# Patient Record
Sex: Female | Born: 1990
Health system: Southern US, Community
[De-identification: ages and names within clinical notes are randomized; demographics above are authoritative.]

## PROBLEM LIST (undated history)

## (undated) DIAGNOSIS — N879 Dysplasia of cervix uteri, unspecified: Secondary | ICD-10-CM

## (undated) DIAGNOSIS — N611 Abscess of the breast and nipple: Secondary | ICD-10-CM

## (undated) DIAGNOSIS — R87619 Unspecified abnormal cytological findings in specimens from cervix uteri: Secondary | ICD-10-CM

## (undated) DIAGNOSIS — F419 Anxiety disorder, unspecified: Secondary | ICD-10-CM

## (undated) DIAGNOSIS — J45909 Unspecified asthma, uncomplicated: Secondary | ICD-10-CM

## (undated) DIAGNOSIS — J302 Other seasonal allergic rhinitis: Secondary | ICD-10-CM

## (undated) DIAGNOSIS — Z3400 Encounter for supervision of normal first pregnancy, unspecified trimester: Secondary | ICD-10-CM

## (undated) HISTORY — DX: Encounter for supervision of normal first pregnancy, unspecified trimester: Z34.00

## (undated) HISTORY — DX: Unspecified abnormal cytological findings in specimens from cervix uteri: R87.619

---

## 2013-01-12 HISTORY — PX: COLPOSCOPY: SHX161

## 2013-08-02 DIAGNOSIS — N879 Dysplasia of cervix uteri, unspecified: Secondary | ICD-10-CM | POA: Insufficient documentation

## 2014-11-18 ENCOUNTER — Other Ambulatory Visit: Payer: Self-pay | Admitting: Internal Medicine

## 2014-11-18 ENCOUNTER — Encounter: Payer: Self-pay | Admitting: Internal Medicine

## 2014-11-18 DIAGNOSIS — R87619 Unspecified abnormal cytological findings in specimens from cervix uteri: Secondary | ICD-10-CM | POA: Insufficient documentation

## 2015-03-15 ENCOUNTER — Encounter: Payer: Self-pay | Admitting: Family Medicine

## 2015-03-15 ENCOUNTER — Ambulatory Visit (INDEPENDENT_AMBULATORY_CARE_PROVIDER_SITE_OTHER): Payer: 59 | Admitting: Family Medicine

## 2015-03-15 VITALS — BP 112/72 | HR 80 | Temp 98.0°F | Ht 60.0 in | Wt 117.0 lb

## 2015-03-15 DIAGNOSIS — J219 Acute bronchiolitis, unspecified: Secondary | ICD-10-CM | POA: Diagnosis not present

## 2015-03-15 DIAGNOSIS — J0100 Acute maxillary sinusitis, unspecified: Secondary | ICD-10-CM

## 2015-03-15 LAB — POCT INFLUENZA A/B
INFLUENZA A, POC: NEGATIVE
Influenza B, POC: NEGATIVE

## 2015-03-15 MED ORDER — GUAIFENESIN-CODEINE 100-10 MG/5ML PO SYRP: 5.0000 mL | ORAL_SOLUTION | Freq: Three times a day (TID) | ORAL | Status: DC | PRN

## 2015-03-15 MED ORDER — AMOXICILLIN 500 MG PO CAPS: 500.0000 mg | ORAL_CAPSULE | Freq: Three times a day (TID) | ORAL | Status: DC

## 2015-03-15 NOTE — Progress Notes (Signed)
Name: Kristina Kim   MRN: [id number]    DOB: [birth year]   Date:03/15/2015       Progress Note  Subjective  Chief Complaint  Chief Complaint  Patient presents with  . Sinusitis    cough and cong, stuffy nose, fatigue    Sinusitis This is a new problem. The current episode started in the past 7 days. The problem has been waxing and waning since onset. The maximum temperature recorded prior to her arrival was 100.4 - 100.9 F. Her pain is at a severity of 3/10. The pain is mild. Associated symptoms include chills, congestion, coughing, diaphoresis, headaches, sinus pressure and a sore throat. Pertinent negatives include no ear pain, neck pain or shortness of breath. Past treatments include acetaminophen and oral decongestants. The treatment provided mild relief.    No problem-specific assessment & plan notes found for this encounter.   History reviewed. No pertinent past medical history.  Past Surgical History  Procedure Laterality Date  . Colposcopy  2015    abnormal Pap [facility]    Family History  Problem Relation Age of Onset  . Breast cancer Paternal Grandmother     Social History   Social History  . Marital Status: Single    Spouse Name: N/A  . Number of Children: N/A  . Years of Education: N/A   Occupational History  . Not on file.   Social History Main Topics  . Smoking status: Never Smoker   . Smokeless tobacco: Not on file  . Alcohol Use: No  . Drug Use: Not on file  . Sexual Activity: Yes   Other Topics Concern  . Not on file   Social History Narrative    No Known Allergies   Review of Systems  Constitutional: Positive for chills and diaphoresis. Negative for fever, weight loss and malaise/fatigue.  HENT: Positive for congestion, sinus pressure and sore throat. Negative for ear discharge and ear pain.   Eyes: Negative for blurred vision.  Respiratory: Positive for cough. Negative for sputum production, shortness of breath and wheezing.   Cardiovascular:  Negative for chest pain, palpitations and leg swelling.  Gastrointestinal: Negative for heartburn, nausea, abdominal pain, diarrhea, constipation, blood in stool and melena.  Genitourinary: Negative for dysuria, urgency, frequency and hematuria.  Musculoskeletal: Negative for myalgias, back pain, joint pain and neck pain.  Skin: Negative for rash.  Neurological: Positive for headaches. Negative for dizziness, tingling, sensory change and focal weakness.  Endo/Heme/Allergies: Negative for environmental allergies and polydipsia. Does not bruise/bleed easily.  Psychiatric/Behavioral: Negative for depression and suicidal ideas. The patient is not nervous/anxious and does not have insomnia.      Objective  Filed Vitals:   03/15/15 1440  BP: 112/72  Pulse: 80  Temp: 98 F (36.7 C)  TempSrc: Oral  Height: 5' (1.524 m)  Weight: 117 lb (53.071 kg)  SpO2: 99%    Physical Exam  Constitutional: She is well-developed, well-nourished, and in no distress. No distress.  HENT:  Head: Normocephalic and atraumatic.  Right Ear: External ear normal.  Left Ear: External ear normal.  Nose: Nose normal.  Mouth/Throat: Oropharynx is clear and moist.  Eyes: Conjunctivae and EOM are normal. Pupils are equal, round, and reactive to light. Right eye exhibits no discharge. Left eye exhibits no discharge.  Neck: Normal range of motion. Neck supple. No JVD present. No thyromegaly present.  Cardiovascular: Normal rate, regular rhythm, normal heart sounds and intact distal pulses.  Exam reveals no gallop and no friction rub.  No murmur heard. Pulmonary/Chest: Effort normal and breath sounds normal.  Abdominal: Soft. Bowel sounds are normal. She exhibits no mass. There is no tenderness. There is no guarding.  Musculoskeletal: Normal range of motion. She exhibits no edema.  Lymphadenopathy:    She has no cervical adenopathy.  Neurological: She is alert.  Skin: Skin is warm and dry. She is not diaphoretic.   Psychiatric: Mood and affect normal.  Nursing note and vitals reviewed.     Assessment & Plan  Problem List Items Addressed This Visit    None    Visit Diagnoses    Acute maxillary sinusitis, recurrence not specified    -  Primary    Relevant Medications    amoxicillin (AMOXIL) 500 MG capsule    guaiFENesin-codeine (ROBITUSSIN AC) 100-10 MG/5ML syrup    Bronchiolitis        Relevant Medications    amoxicillin (AMOXIL) 500 MG capsule    guaiFENesin-codeine (ROBITUSSIN AC) 100-10 MG/5ML syrup    Other Relevant Orders    POCT Influenza A/B (Completed)         Dr. Hayden Rasmusseneanna Jones [facility]  03/15/2015

## 2015-08-21 ENCOUNTER — Encounter: Payer: Self-pay | Admitting: Internal Medicine

## 2015-08-21 ENCOUNTER — Ambulatory Visit (INDEPENDENT_AMBULATORY_CARE_PROVIDER_SITE_OTHER): Payer: 59 | Admitting: Internal Medicine

## 2015-08-21 ENCOUNTER — Other Ambulatory Visit: Payer: Self-pay | Admitting: Internal Medicine

## 2015-08-21 VITALS — BP 121/84 | HR 76 | Temp 98.7°F | Resp 16 | Ht 60.0 in | Wt 123.2 lb

## 2015-08-21 DIAGNOSIS — N926 Irregular menstruation, unspecified: Secondary | ICD-10-CM | POA: Diagnosis not present

## 2015-08-21 DIAGNOSIS — K2970 Gastritis, unspecified, without bleeding: Secondary | ICD-10-CM | POA: Diagnosis not present

## 2015-08-21 DIAGNOSIS — R1033 Periumbilical pain: Secondary | ICD-10-CM | POA: Diagnosis not present

## 2015-08-21 DIAGNOSIS — N912 Amenorrhea, unspecified: Secondary | ICD-10-CM | POA: Diagnosis not present

## 2015-08-21 LAB — POCT URINE PREGNANCY: PREG TEST UR: NEGATIVE

## 2015-08-21 LAB — POCT URINALYSIS DIPSTICK
Bilirubin, UA: NEGATIVE
GLUCOSE UA: NEGATIVE
Ketones, UA: NEGATIVE
Leukocytes, UA: NEGATIVE
NITRITE UA: NEGATIVE
PROTEIN UA: NEGATIVE
RBC UA: NEGATIVE
Spec Grav, UA: 1.01
pH, UA: 5

## 2015-08-21 MED ORDER — OMEPRAZOLE 20 MG PO CPDR: 20.0000 mg | DELAYED_RELEASE_CAPSULE | Freq: Every day | ORAL | 1 refills | Status: DC

## 2015-08-21 NOTE — Progress Notes (Signed)
Date:  08/21/2015   Name:  Kristina Kim   DOB:  04/09/1990   MRN:  [id number]   Chief Complaint: Abdominal Pain (2 weeks of Umbillical Region pain and readiates to R side LMP 06/22/2015 No Birth Control Method.)  Patient has always had irregular periods.  She was on OCPs for a while last year but stopped them due to concern over side effects.  Over the past 6 months her periods were regular but now the last one was 2 months ago.  Home pregnancy tests were negative last week.  She is having some pelvic fullness and mild discomfort but no bleeding.  Nausea/epigastric discomfort - over the past 2 weeks she has had some nausea without vomiting.  Epigastric burning and mild reflux as well.  She does not drink alcohol regularly.  She does not smoke.  She does not take regular doses of nsaids.  She has been under some stress recently.  Review of Systems  Constitutional: Negative for chills, fatigue, fever and unexpected weight change.  Respiratory: Negative for chest tightness and shortness of breath.   Cardiovascular: Negative for chest pain, palpitations and leg swelling.  Gastrointestinal: Positive for abdominal pain and nausea. Negative for blood in stool, constipation, diarrhea and vomiting.  Genitourinary: Negative for difficulty urinating, vaginal bleeding and vaginal discharge.    Patient Active Problem List   Diagnosis Date Noted  . Abnormal cervical Papanicolaou smear 11/18/2014    Prior to Admission medications   Not on File    No Known Allergies  Past Surgical History:  Procedure Laterality Date  . COLPOSCOPY  2015   abnormal Pap [facility]    Social History  Substance Use Topics  . Smoking status: Never Smoker  . Smokeless tobacco: Never Used  . Alcohol use No     Medication list has been reviewed and updated.   Physical Exam  Constitutional: She is oriented to person, place, and time. She appears well-developed. No distress.  HENT:  Head: Normocephalic and  atraumatic.  Neck: Normal range of motion. Neck supple. No thyromegaly present.  Cardiovascular: Normal rate, regular rhythm and normal heart sounds.   Pulmonary/Chest: Effort normal and breath sounds normal. No respiratory distress.  Abdominal: There is no hepatosplenomegaly. There is tenderness in the epigastric area and suprapubic area. There is no rigidity, no rebound, no guarding and no CVA tenderness.  Musculoskeletal: Normal range of motion.  Lymphadenopathy:    She has no cervical adenopathy.  Neurological: She is alert and oriented to person, place, and time.  Skin: Skin is warm and dry. No rash noted.  Psychiatric: Her speech is normal and behavior is normal. Thought content normal. Her mood appears anxious.  Nursing note and vitals reviewed.   BP 121/84 (BP Location: Right Arm, Patient Position: Sitting, Cuff Size: Normal)   Pulse 76   Temp 98.7 F (37.1 C) (Oral)   Resp 16   Ht 5' (1.524 m)   Wt 123 lb 3.2 oz (55.9 kg)   LMP 06/22/2015 (Approximate)   SpO2 100%   BMI 24.06 kg/m   Assessment and Plan: 1. Periumbilical abdominal pain UA negative for infection - POCT urinalysis dipstick  2. Irregular menses HCG negative - POCT urine pregnancy  3. Gastritis Begin PPI - omeprazole (PRILOSEC) 20 MG capsule; Take 1 capsule (20 mg total) by mouth daily.  Dispense: 30 capsule; Refill: 1  4. Amenorrhea Recommend follow up with GYN next week as scheduled   Bari Edward, MD [facility]  [facility]  08/21/2015

## 2016-04-27 ENCOUNTER — Encounter: Payer: Self-pay | Admitting: Advanced Practice Midwife

## 2016-04-27 ENCOUNTER — Ambulatory Visit (INDEPENDENT_AMBULATORY_CARE_PROVIDER_SITE_OTHER): Payer: 59 | Admitting: Advanced Practice Midwife

## 2016-04-27 DIAGNOSIS — Z01419 Encounter for gynecological examination (general) (routine) without abnormal findings: Secondary | ICD-10-CM | POA: Diagnosis not present

## 2016-04-27 DIAGNOSIS — Z124 Encounter for screening for malignant neoplasm of cervix: Secondary | ICD-10-CM | POA: Diagnosis not present

## 2016-04-27 DIAGNOSIS — Z113 Encounter for screening for infections with a predominantly sexual mode of transmission: Secondary | ICD-10-CM

## 2016-04-27 NOTE — Progress Notes (Signed)
Patient ID: Arrielle Mcginn, female   DOB: [birth year], 26 y.o.   MRN: [id number]     Gynecology Annual Exam  PCP: No PCP Per Patient  Chief Complaint:  Chief Complaint  Patient presents with  . Gynecologic Exam    History of Present Illness: Patient is a 26 y.o. G0P0000 presents for annual exam. The patient has no complaints today.   LMP: Patient's last menstrual period was 04/20/2016. Average Interval: regular, 28 days Duration of flow: 5 days Heavy Menses: no Clots: no Intermenstrual Bleeding: no Postcoital Bleeding: no Dysmenorrhea: no  The patient is sexually active. She currently uses Condoms for contraception. She denies dyspareunia.  The patient does not perform self breast exams.  There is no notable family history of breast or ovarian cancer in her family. The patient's paternal grandmother was diagnosed with breast cancer in her 47's.  The patient wears seatbelts: yes.  The patient has regular exercise: yes.  She does strength exercises 6 days per week. She denies cardiac exercise.  The patient denies current symptoms of depression.    Review of Systems: Review of Systems  Constitutional: Negative.   HENT: Negative.   Eyes: Negative.   Respiratory: Negative.   Cardiovascular: Negative.   Gastrointestinal: Negative.   Genitourinary: Negative.   Musculoskeletal: Negative.   Skin: Negative.   Neurological: Negative.   Endo/Heme/Allergies: Negative.   Psychiatric/Behavioral: Negative.     Past Medical History:  Past Medical History:  Diagnosis Date  . Abnormal Pap smear of cervix     Past Surgical History:  Past Surgical History:  Procedure Laterality Date  . COLPOSCOPY  2015   abnormal Pap [facility]    Gynecologic History:  Patient's last menstrual period was 04/20/2016. Contraception: condoms Last Pap: Results were: no abnormalities   Obstetric History: G0P0000  Family History:  Family History  Problem Relation Age of Onset  . Breast cancer  Paternal Grandmother   . Cancer Maternal Grandmother 66    vaginal    Social History:  Social History   Social History  . Marital status: Single    Spouse name: N/A  . Number of children: N/A  . Years of education: N/A   Occupational History  . Not on file.   Social History Main Topics  . Smoking status: Never Smoker  . Smokeless tobacco: Never Used  . Alcohol use 0.0 oz/week  . Drug use: No  . Sexual activity: Yes    Birth control/ protection: None     Comment: Irregular periods in past   Other Topics Concern  . Not on file   Social History Narrative  . No narrative on file    Allergies:  No Known Allergies  Medications: Prior to Admission medications   Medication Sig Start Date End Date Taking? Authorizing Provider  omeprazole (PRILOSEC) 20 MG capsule Take 1 capsule (20 mg total) by mouth daily. Patient not taking: Reported on 04/27/2016 08/21/15   Reubin Milan, MD    Physical Exam Vitals: Blood pressure 120/80, pulse 62, height 5' (1.524 m), weight 135 lb (61.2 kg), last menstrual period 04/20/2016.  General: NAD HEENT: normocephalic, anicteric Thyroid: no enlargement, no palpable nodules Pulmonary: No increased work of breathing, CTAB Cardiovascular: RRR, distal pulses 2+ Breast: Breast symmetrical, no tenderness, no palpable nodules or masses, no skin or nipple retraction present, no nipple discharge.  No axillary or supraclavicular lymphadenopathy. Abdomen: NABS, soft, non-tender, non-distended.  Umbilicus without lesions.  No hepatomegaly, splenomegaly or masses palpable. No evidence of  hernia  Genitourinary:  External: Normal external female genitalia.  Normal urethral meatus, normal  Bartholin's and Skene's glands.    Vagina: Normal vaginal mucosa, no evidence of prolapse.    Cervix: Grossly normal in appearance, no bleeding  Uterus: Non-enlarged, mobile, normal contour.  No CMT  Adnexa: ovaries non-enlarged, no adnexal masses  Rectal:  deferred  Lymphatic: no evidence of inguinal lymphadenopathy Extremities: no edema, erythema, or tenderness Neurologic: Grossly intact Psychiatric: mood appropriate, affect full    Assessment: 26 y.o. G0P0000 Well woman exam with PAP smear   Plan: Problem List Items Addressed This Visit    None    Visit Diagnoses    Well woman exam with routine gynecological exam       Relevant Orders   IGP,CtNgTv,rfx Aptima HPV ASCU   Screen for sexually transmitted diseases       Relevant Orders   IGP,CtNgTv,rfx Aptima HPV ASCU   Cervical cancer screening       Relevant Orders   IGP,CtNgTv,rfx Aptima HPV ASCU      1) Increase healthy lifestyle diet and cardiac exercise  2) STI screening was offered and accepted  3) ASCCP guidelines and rational discussed.  Patient opts for yearly screening interval  4) Contraception - Condoms- education given on effectiveness  5) Follow up 1 year for routine annual exam   Tresea Mall, CNM

## 2016-04-29 LAB — IGP,CTNGTV,RFX APTIMA HPV ASCU
CHLAMYDIA, NUC. ACID AMP: NEGATIVE
Gonococcus, Nuc. Acid Amp: NEGATIVE
PAP SMEAR COMMENT: 0
TRICH VAG BY NAA: NEGATIVE

## 2017-04-26 ENCOUNTER — Encounter: Payer: Self-pay | Admitting: Internal Medicine

## 2017-04-26 ENCOUNTER — Ambulatory Visit (INDEPENDENT_AMBULATORY_CARE_PROVIDER_SITE_OTHER): Payer: 59 | Admitting: Internal Medicine

## 2017-04-26 VITALS — BP 128/76 | HR 70 | Ht 60.0 in | Wt 136.8 lb

## 2017-04-26 DIAGNOSIS — R300 Dysuria: Secondary | ICD-10-CM

## 2017-04-26 LAB — POC URINALYSIS WITH MICROSCOPIC (NON AUTO)MANUAL RESULT
Crystals: 0
EPITHELIAL CELLS, URINE PER MICROSCOPY: 3
LEUKOCYTES UA: NEGATIVE
Mucus, UA: 0
RBC: 5 M/uL (ref 4.04–5.48)
UROBILINOGEN UA: NEGATIVE U/dL — AB
WBC Casts, UA: 10
pH, UA: >=9 — AB (ref 5.0–8.0)

## 2017-04-26 MED ORDER — CEPHALEXIN 500 MG PO CAPS: 500.0000 mg | ORAL_CAPSULE | Freq: Four times a day (QID) | ORAL | 0 refills | Status: AC

## 2017-04-26 NOTE — Patient Instructions (Signed)

## 2017-04-26 NOTE — Progress Notes (Signed)
    Date:  04/26/2017   Name:  Kristina CoveKyana Lavallee   DOB:  25-Dec-1990   MRN:  [id number]   Chief Complaint: Urinary Tract Infection (Lower back pain, throbbing when urinating. Burning when urinating. Took AZO last night and today. Started lastnight around 8pm.  )  Urinary Tract Infection   This is a new problem. The current episode started yesterday. The problem occurs every urination. The problem has been unchanged. The quality of the pain is described as burning. The pain is mild. There has been no fever. Associated symptoms include frequency, nausea and urgency. Pertinent negatives include no chills. Treatments tried: AZO.   Last menses last week.  She does not use birth control and does not think that she is pregnant.    Review of Systems  Constitutional: Negative for chills, fatigue and unexpected weight change.  Respiratory: Negative for chest tightness and shortness of breath.   Cardiovascular: Negative for chest pain.  Gastrointestinal: Positive for nausea.  Genitourinary: Positive for frequency and urgency.  Musculoskeletal: Positive for back pain (low back ache). Negative for arthralgias.    Patient Active Problem List   Diagnosis Date Noted  . Abnormal cervical Papanicolaou smear 11/18/2014    Prior to Admission medications   Medication Sig Start Date End Date Taking? Authorizing Provider  omeprazole (PRILOSEC) 20 MG capsule Take 1 capsule (20 mg total) by mouth daily. 08/21/15  Yes Reubin MilanBerglund, Rooney Gladwin H, MD    No Known Allergies  Past Surgical History:  Procedure Laterality Date  . COLPOSCOPY  2015   abnormal Pap [facility]    Social History   Tobacco Use  . Smoking status: Never Smoker  . Smokeless tobacco: Never Used  Substance Use Topics  . Alcohol use: Yes    Alcohol/week: 0.0 oz  . Drug use: No     Medication list has been reviewed and updated.  PHQ 2/9 Scores 04/26/2017 08/21/2015 03/15/2015  PHQ - 2 Score 0 2 0  PHQ- 9 Score 0 5 -    Physical Exam  Constitutional:  She appears well-developed and well-nourished.  Cardiovascular: Normal rate, regular rhythm and normal heart sounds.  Pulmonary/Chest: Effort normal and breath sounds normal. No respiratory distress.  Abdominal: Soft. Bowel sounds are normal. There is tenderness in the suprapubic area. There is no rebound, no guarding and no CVA tenderness.  Psychiatric: She has a normal mood and affect.  Nursing note and vitals reviewed.   BP 128/76   Pulse 70   Ht 5' (1.524 m)   Wt 136 lb 12.8 oz (62.1 kg)   LMP 04/20/2017 (Approximate)   SpO2 100%   BMI 26.72 kg/m   Assessment and Plan: 1. Dysuria Continue AZO if helpful Increase fluids - POC urinalysis w microscopic (non auto) - cephALEXin (KEFLEX) 500 MG capsule; Take 1 capsule (500 mg total) by mouth 4 (four) times daily for 7 days.  Dispense: 28 capsule; Refill: 0   Meds ordered this encounter  Medications  . cephALEXin (KEFLEX) 500 MG capsule    Sig: Take 1 capsule (500 mg total) by mouth 4 (four) times daily for 7 days.    Dispense:  28 capsule    Refill:  0    Partially dictated using Animal nutritionistDragon software. Any errors are unintentional.  Bari EdwardLaura Brookes Craine, MD [facility]  04/26/2017

## 2018-09-27 ENCOUNTER — Other Ambulatory Visit: Payer: Self-pay

## 2018-09-27 ENCOUNTER — Emergency Department
Admission: EM | Admit: 2018-09-27 | Discharge: 2018-09-27 | Disposition: A | Discharge status: Home / Self Care | Payer: Self-pay | Attending: Emergency Medicine | Admitting: Emergency Medicine

## 2018-09-27 DIAGNOSIS — N611 Abscess of the breast and nipple: Secondary | ICD-10-CM | POA: Insufficient documentation

## 2018-09-27 DIAGNOSIS — N61 Mastitis without abscess: Secondary | ICD-10-CM

## 2018-09-27 DIAGNOSIS — Z79899 Other long term (current) drug therapy: Secondary | ICD-10-CM | POA: Insufficient documentation

## 2018-09-27 MED ORDER — CEPHALEXIN 500 MG PO CAPS: 500.0000 mg | ORAL_CAPSULE | Freq: Four times a day (QID) | ORAL | 0 refills | Status: AC

## 2018-09-27 MED ORDER — CEPHALEXIN 500 MG PO CAPS
500.0000 mg | ORAL_CAPSULE | Freq: Once | ORAL | Status: AC
  Administered 2018-09-27: 12:00:00 500 mg via ORAL
  Filled 2018-09-27: qty 1

## 2018-09-27 NOTE — ED Provider Notes (Signed)
[facility] Emergency Department Provider Note ____________________________________________  Time seen: 1124  I have reviewed the triage vital signs and the nursing notes.  HISTORY  Chief Complaint  Abscess  HPI Kristina Kim is a 28 y.o. female presents herself to the ED for evaluation of a 4 to 5-day complaint of increasing pain to the right breast at the nipple.   Patient describes increasing tenderness and firmness to the medial aspect of the areola.  She denies any nipple discharge, skin redness, or puckering.  He also denies any fevers, chills, or sweats.  She denies any breast-feeding or concerns for mastitis.  She also denies any history of nipple piercing on the right.  Past Medical History:  Diagnosis Date  . Abnormal Pap smear of cervix     Patient Active Problem List   Diagnosis Date Noted  . Abnormal cervical Papanicolaou smear 11/18/2014    Past Surgical History:  Procedure Laterality Date  . COLPOSCOPY  2015   abnormal Pap [facility]    Prior to Admission medications   Medication Sig Start Date End Date Taking? Authorizing Provider  cephALEXin (KEFLEX) 500 MG capsule Take 1 capsule (500 mg total) by mouth 4 (four) times daily for 7 days. 09/27/18 10/04/18  Tonni Mansour, Dannielle Karvonen, PA-C  omeprazole (PRILOSEC) 20 MG capsule Take 1 capsule (20 mg total) by mouth daily. 08/21/15   Glean Hess, MD    Allergies Patient has no known allergies.  Family History  Problem Relation Age of Onset  . Breast cancer Paternal Grandmother   . Cancer Maternal Grandmother 24       vaginal    Social History Social History   Tobacco Use  . Smoking status: Never Smoker  . Smokeless tobacco: Never Used  Substance Use Topics  . Alcohol use: Yes    Alcohol/week: 0.0 standard drinks  . Drug use: No    Review of Systems  Constitutional: Negative for fever. Eyes: Negative for visual changes. ENT: Negative for sore throat. Cardiovascular: Negative for  chest pain. Respiratory: Negative for shortness of breath. Gastrointestinal: Negative for abdominal pain, vomiting and diarrhea. Genitourinary: Negative for dysuria. Musculoskeletal: Negative for back pain. Skin: Negative for rash. Right breast pain as above Neurological: Negative for headaches, focal weakness or numbness. ____________________________________________  PHYSICAL EXAM:  VITAL SIGNS: ED Triage Vitals [09/27/18 1042]  Enc Vitals Group     BP 132/69     Pulse Rate 87     Resp 17     Temp 98.5 F (36.9 C)     Temp Source Oral     SpO2 99 %     Weight 154 lb (69.9 kg)     Height 5\' 1"  (1.549 m)     Head Circumference      Peak Flow      Pain Score 6     Pain Loc      Pain Edu?      Excl. in [place]?     Constitutional: Alert and oriented. Well appearing and in no distress. Head: Normocephalic and atraumatic. Eyes: Conjunctivae are normal. Normal extraocular movements Hematological/Lymphatic/Immunological: No cervical, clavicular, or axillary lymphadenopathy. Cardiovascular: Normal rate, regular rhythm.  Respiratory: Normal respiratory effort.  Musculoskeletal: Nontender with normal range of motion in all extremities.  Neurologic:  Normal gait without ataxia. Normal speech and language. No gross focal neurologic deficits are appreciated. Skin:  Skin is warm, dry and intact. No rash noted. Right breast with focal area of subcutaneous edema at  between 12 o'clock and 4 o'clock. The are is firm but the overlying skin is without induration or erythema. No pointing, fluctuance, or purulence is appreciated. No nipple discharge on manipulation. No dimpling, nipple retraction, or peau d'orange skin changes.   ____________________________________________   RADIOLOGY  POCUS - Right Breast    US to the right areola/nipple complex reveals a focal area of heterogeneous echogenicity and a well-defined border under the skin. No defined abscess noted.   ____________________________________________  PROCEDURES  Keflex 500 mg PO Procedures ____________________________________________  INITIAL IMPRESSION / ASSESSMENT AND PLAN / ED COURSE  Patient with ED evaluation of right breast pain secondary to subcutaneous cellulitis.  Patient's clinical picture is consistent with a probable breast cellulitis without focal abscess formation.  Her ultrasound at bedside is reassuring at this point as it shows just a focal cellulitis patient was treated empirically with Keflex, and instructions for close ED follow-up for wound check.  Patient will apply warm compress to promote healing.  She will return immediately for any abscess formation, pointing, fluctuance, or spontaneous drainage from the skin of the nipple.  Kristina Kim was evaluated in Emergency Department on 09/27/2018 for the symptoms described in the history of present illness. She was evaluated in the context of the global COVID-19 pandemic, which necessitated consideration that the patient might be at risk for infection with the SARS-CoV-2 virus that causes COVID-19. Institutional protocols and algorithms that pertain to the evaluation of patients at risk for COVID-19 are in a state of rapid change based on information released by regulatory bodies including the CDC and federal and state organizations. These policies and algorithms were followed during the patient's care in the ED. ____________________________________________  FINAL CLINICAL IMPRESSION(S) / ED DIAGNOSES  Final diagnoses:  Cellulitis of right breast      Lissa HoardMenshew, Mikaeel Petrow V Bacon, PA-C 09/27/18 1619    Chesley NoonJessup, Charles, MD 09/28/18 1520

## 2018-09-27 NOTE — ED Notes (Signed)
See triage note  Presents with painful area to right breast    States area is tender and swollen

## 2018-09-27 NOTE — Discharge Instructions (Signed)
You have been treated for a breast cellulitis. Take the prescription antibiotic as directed. Apply warm compresses to promote healing. Return to the ED for worsening symptoms as discussed.

## 2018-09-27 NOTE — ED Triage Notes (Signed)
Pt c/o swollen tender area to the right breast for the past 4-5 days.

## 2019-01-16 ENCOUNTER — Other Ambulatory Visit: Payer: Self-pay

## 2019-01-16 ENCOUNTER — Observation Stay
Admission: EM | Admit: 2019-01-16 | Discharge: 2019-01-17 | Disposition: A | Discharge status: Home / Self Care | Payer: Self-pay | Attending: Surgery | Admitting: Surgery

## 2019-01-16 ENCOUNTER — Emergency Department: Payer: Self-pay

## 2019-01-16 DIAGNOSIS — D72829 Elevated white blood cell count, unspecified: Secondary | ICD-10-CM | POA: Insufficient documentation

## 2019-01-16 DIAGNOSIS — N611 Abscess of the breast and nipple: Principal | ICD-10-CM | POA: Diagnosis present

## 2019-01-16 DIAGNOSIS — Z803 Family history of malignant neoplasm of breast: Secondary | ICD-10-CM | POA: Insufficient documentation

## 2019-01-16 DIAGNOSIS — Z20822 Contact with and (suspected) exposure to covid-19: Secondary | ICD-10-CM | POA: Insufficient documentation

## 2019-01-16 LAB — CBC WITH DIFFERENTIAL/PLATELET
Abs Immature Granulocytes: 0.04 10*3/uL (ref 0.00–0.07)
Basophils Absolute: 0 10*3/uL (ref 0.0–0.1)
Basophils Relative: 0 %
Eosinophils Absolute: 0.1 10*3/uL (ref 0.0–0.5)
Eosinophils Relative: 1 %
HCT: 39.4 % (ref 36.0–46.0)
Hemoglobin: 12.9 g/dL (ref 12.0–15.0)
Immature Granulocytes: 0 %
Lymphocytes Relative: 17 %
Lymphs Abs: 2.3 10*3/uL (ref 0.7–4.0)
MCH: 27.4 pg (ref 26.0–34.0)
MCHC: 32.7 g/dL (ref 30.0–36.0)
MCV: 83.8 fL (ref 80.0–100.0)
Monocytes Absolute: 1 10*3/uL (ref 0.1–1.0)
Monocytes Relative: 7 %
Neutro Abs: 9.7 10*3/uL — ABNORMAL HIGH (ref 1.7–7.7)
Neutrophils Relative %: 75 %
Platelets: 337 10*3/uL (ref 150–400)
RBC: 4.7 MIL/uL (ref 3.87–5.11)
RDW: 13.4 % (ref 11.5–15.5)
WBC: 13.1 10*3/uL — ABNORMAL HIGH (ref 4.0–10.5)
nRBC: 0 % (ref 0.0–0.2)

## 2019-01-16 LAB — COMPREHENSIVE METABOLIC PANEL
ALT: 18 U/L (ref 0–44)
AST: 24 U/L (ref 15–41)
Albumin: 4.3 g/dL (ref 3.5–5.0)
Alkaline Phosphatase: 81 U/L (ref 38–126)
Anion gap: 10 (ref 5–15)
BUN: 7 mg/dL (ref 6–20)
CO2: 22 mmol/L (ref 22–32)
Calcium: 9.2 mg/dL (ref 8.9–10.3)
Chloride: 105 mmol/L (ref 98–111)
Creatinine, Ser: 0.65 mg/dL (ref 0.44–1.00)
GFR calc Af Amer: >60 mL/min (ref >60)
GFR calc non Af Amer: >60 mL/min (ref >60)
Glucose, Bld: 105 mg/dL — ABNORMAL HIGH (ref 70–99)
Potassium: 3.8 mmol/L (ref 3.5–5.1)
Sodium: 137 mmol/L (ref 135–145)
Total Bilirubin: 0.6 mg/dL (ref 0.3–1.2)
Total Protein: 8.1 g/dL (ref 6.5–8.1)

## 2019-01-16 LAB — LACTIC ACID, PLASMA: Lactic Acid, Venous: 1.1 mmol/L (ref 0.5–1.9)

## 2019-01-16 MED ORDER — CEFAZOLIN SODIUM-DEXTROSE 1-4 GM/50ML-% IV SOLN
1.0000 g | Freq: Three times a day (TID) | INTRAVENOUS | Status: DC
  Administered 2019-01-17: 06:00:00 1 g via INTRAVENOUS
  Filled 2019-01-16 (×3): qty 50

## 2019-01-16 MED ORDER — MORPHINE SULFATE (PF) 2 MG/ML IV SOLN
2.0000 mg | INTRAVENOUS | Status: DC | PRN
  Administered 2019-01-17: 2 mg via INTRAVENOUS
  Filled 2019-01-16: qty 1

## 2019-01-16 MED ORDER — ACETAMINOPHEN 325 MG PO TABS: 650.0000 mg | ORAL_TABLET | Freq: Four times a day (QID) | ORAL | Status: DC | PRN

## 2019-01-16 MED ORDER — DOCUSATE SODIUM 100 MG PO CAPS: 100.0000 mg | ORAL_CAPSULE | Freq: Two times a day (BID) | ORAL | Status: DC | PRN

## 2019-01-16 MED ORDER — LACTATED RINGERS IV SOLN: INTRAVENOUS | Status: DC

## 2019-01-16 MED ORDER — HYDROCODONE-ACETAMINOPHEN 5-325 MG PO TABS: 1.0000 | ORAL_TABLET | ORAL | Status: DC | PRN

## 2019-01-16 MED ORDER — ONDANSETRON HCL 4 MG/2ML IJ SOLN: 4.0000 mg | Freq: Four times a day (QID) | INTRAMUSCULAR | Status: DC | PRN

## 2019-01-16 MED ORDER — ONDANSETRON 4 MG PO TBDP: 4.0000 mg | ORAL_TABLET | Freq: Four times a day (QID) | ORAL | Status: DC | PRN

## 2019-01-16 MED ORDER — ALPRAZOLAM 0.25 MG PO TABS
0.2500 mg | ORAL_TABLET | Freq: Once | ORAL | Status: AC
  Administered 2019-01-16: 22:00:00 0.25 mg via ORAL
  Filled 2019-01-16: qty 1

## 2019-01-16 MED ORDER — TRAMADOL HCL 50 MG PO TABS: 50.0000 mg | ORAL_TABLET | Freq: Four times a day (QID) | ORAL | Status: DC | PRN

## 2019-01-16 MED ORDER — SODIUM CHLORIDE 0.9 % IV SOLN
1.0000 g | Freq: Once | INTRAVENOUS | Status: AC
  Administered 2019-01-16: 21:00:00 1 g via INTRAVENOUS
  Filled 2019-01-16: qty 10

## 2019-01-16 NOTE — ED Notes (Signed)
See triage note  Presents with some swelling and pain to right breast area  States she noticed this 4 days ago  Hx of same  States she used the antibiotic and warm compresses  States area had gotten better

## 2019-01-16 NOTE — ED Triage Notes (Signed)
Pt c/o right breast pain with selling and redness for the past 4 days., states she was seen here for the same thing around a month ago and given abx.

## 2019-01-16 NOTE — H&P (Signed)
Subjective:   CC: Right breast abscess  HPI:  Kristina Kim is a 29 y.o. female who was consulted  for evaluation of above.  First noted 4 days ago.  Symptoms include: Pain is sharp, localized to the breast.  Exacerbated by palpation.  Alleviated by nothing specific.  Associated with increased swelling, erythema.  Denies discharge.  Patient had a similar episode before her controlled with antibiotics.  This time pain did not resolve so came into the emergency department.  Currently not breast-feeding.  No history of trauma.   Past Medical History:  has a past medical history of Abnormal Pap smear of cervix.  Past Surgical History:  has a past surgical history that includes Colposcopy (2015).  Family History: family history includes Breast cancer in her paternal grandmother; Cancer (age of onset: 79) in her maternal grandmother.  Social History:  reports that she has never smoked. She has never used smokeless tobacco. She reports current alcohol use. She reports that she does not use drugs.  Current Medications: None reported  Allergies:  No Known Allergies  ROS:  General: Denies weight loss, weight gain, fatigue, fevers, chills, and night sweats. Eyes: Denies blurry vision, double vision, eye pain, itchy eyes, and tearing. Ears: Denies hearing loss, earache, and ringing in ears. Nose: Denies sinus pain, congestion, infections, runny nose, and nosebleeds. Mouth/throat: Denies hoarseness, sore throat, bleeding gums, and difficulty swallowing. Heart: Denies chest pain, palpitations, racing heart, irregular heartbeat, leg pain or swelling, and decreased activity tolerance. Respiratory: Denies breathing difficulty, shortness of breath, wheezing, cough, and sputum. GI: Denies change in appetite, heartburn, nausea, vomiting, constipation, diarrhea, and blood in stool. GU: Denies difficulty urinating, pain with urinating, urgency, frequency, blood in urine. Musculoskeletal: Denies joint  stiffness, pain, swelling, muscle weakness. Skin: Denies rash, itching, mass, tumors, sores, and boils Neurologic: Denies headache, fainting, dizziness, seizures, numbness, and tingling. Psychiatric: Denies depression, anxiety, difficulty sleeping, and memory loss. Endocrine: Denies heat or cold intolerance, and increased thirst or urination. Blood/lymph: Denies easy bruising, easy bruising, and swollen glands     Objective:     BP (!) 146/85 (BP Location: Left Arm)   Pulse 93   Temp 99.6 F (37.6 C) (Oral)   Resp 18   Ht 5' (1.524 m)   Wt 66.7 kg   LMP 01/06/2019 (Approximate)   SpO2 100%   BMI 28.71 kg/m   Constitutional :  alert, cooperative, appears stated age and no distress  Lymphatics/Throat:  no asymmetry, masses, or scars  Respiratory:  clear to auscultation bilaterally  Cardiovascular:  regular rate and rhythm  Gastrointestinal: soft, non-tender; bowel sounds normal; no masses,  no organomegaly.  Musculoskeletal: Steady gait and movement  Skin: Cool and moist.  Chaperone present for exam.  Right breast with increased erythema surrounding her nipple areolar complex.  Tenderness to palpation within this area.  No visible discharge or overlying skin changes besides the erythema.  Psychiatric: Normal affect, non-agitated, not confused       LABS:  CMP Latest Ref Rng & Units 01/16/2019  Glucose 70 - 99 mg/dL 426(S)  BUN 6 - 20 mg/dL 7  Creatinine 3.41 - 9.62 mg/dL 2.29  Sodium 798 - 921 mmol/L 137  Potassium 3.5 - 5.1 mmol/L 3.8  Chloride 98 - 111 mmol/L 105  CO2 22 - 32 mmol/L 22  Calcium 8.9 - 10.3 mg/dL 9.2  Total Protein 6.5 - 8.1 g/dL 8.1  Total Bilirubin 0.3 - 1.2 mg/dL 0.6  Alkaline Phos 38 - 126 U/L  81  AST 15 - 41 U/L 24  ALT 0 - 44 U/L 18   CBC Latest Ref Rng & Units 01/16/2019  WBC 4.0 - 10.5 K/uL 13.1(H)  Hemoglobin 12.0 - 15.0 g/dL 12.9  Hematocrit 36.0 - 46.0 % 39.4  Platelets 150 - 400 K/uL 337    RADS: Pending final read, but images reviewed  by myself and is consistent with abscess in right breast deep to nipple  Assessment:      Right breast abscess  Plan:     1.  I explained to patient that breast abscesses are typically referred to radiology forImage guided drainage prior to considering open drainage as in the past. Patient is agreeable to overnight ops and potential drainage by radiology tomorrow.  We will admit her, IV antibiotics, IV fluids after midnight when she is n.p.o., and will discuss case with radiologist to see if this is amenable.

## 2019-01-16 NOTE — ED Provider Notes (Signed)
Emergency Department Provider Note  ____________________________________________  Time seen: Approximately 10:32 PM  I have reviewed the triage vital signs and the nursing notes.   HISTORY  Chief Complaint Abscess   Historian Patient     HPI Kristina Kim is a 29 y.o. female presents to the emergency department with right breast pain for the past 4 days.  Patient reports that she has erythema around the right nipple.  She has had similar symptoms 2 to 3 months ago and states that she sought care at [facility] ED.  Patient was seen on 09/27/2018 and had a bedside ultrasound which did not suggest abscess at that time and patient was discharged with Keflex.  Patient reports that her symptoms mostly improved but acutely worsened 4 days ago.  She denies discharge from the nipple.  She has had some chills at home but denies fever.  She denies a history of nipple piercings.  No recent trauma to the right breast.  No history of malignancy.   Past Medical History:  Diagnosis Date  . Abnormal Pap smear of cervix      Immunizations up to date:  Yes.     Past Medical History:  Diagnosis Date  . Abnormal Pap smear of cervix     Patient Active Problem List   Diagnosis Date Noted  . Breast abscess 01/16/2019  . Abnormal cervical Papanicolaou smear 11/18/2014    Past Surgical History:  Procedure Laterality Date  . COLPOSCOPY  2015   abnormal Pap [facility]    Prior to Admission medications   Not on File    Allergies Patient has no known allergies.  Family History  Problem Relation Age of Onset  . Breast cancer Paternal Grandmother   . Cancer Maternal Grandmother 40       vaginal    Social History Social History   Tobacco Use  . Smoking status: Never Smoker  . Smokeless tobacco: Never Used  Substance Use Topics  . Alcohol use: Yes    Alcohol/week: 0.0 standard drinks  . Drug use: No     Review of Systems  Constitutional: No fever/chills Eyes:  No discharge ENT: No  upper respiratory complaints. Respiratory: no cough. No SOB/ use of accessory muscles to breath Gastrointestinal:   No nausea, no vomiting.  No diarrhea.  No constipation. Musculoskeletal: Negative for musculoskeletal pain. Skin: Patient has right breast pain.     ____________________________________________   PHYSICAL EXAM:  VITAL SIGNS: ED Triage Vitals  Enc Vitals Group     BP 01/16/19 1733 (!) 146/85     Pulse Rate 01/16/19 1733 93     Resp 01/16/19 1733 18     Temp 01/16/19 1733 99.6 F (37.6 C)     Temp Source 01/16/19 1733 Oral     SpO2 01/16/19 1733 100 %     Weight 01/16/19 1735 147 lb (66.7 kg)     Height 01/16/19 1735 5' (1.524 m)     Head Circumference --      Peak Flow --      Pain Score 01/16/19 1739 7     Pain Loc --      Pain Edu? --      Excl. in GC? --      Constitutional: Alert and oriented. Well appearing and in no acute distress. Eyes: Conjunctivae are normal. PERRL. EOMI. Head: Atraumatic. ENT:      Ears:       Nose: No congestion/rhinnorhea.      Mouth/Throat: Mucous membranes  are moist.  Neck: No stridor.  No cervical spine tenderness to palpation.  Cardiovascular: Normal rate, regular rhythm. Normal S1 and S2.  Good peripheral circulation. Respiratory: Normal respiratory effort without tachypnea or retractions. Lungs CTAB. Good air entry to the bases with no decreased or absent breath sounds Gastrointestinal: Bowel sounds x 4 quadrants. Soft and nontender to palpation. No guarding or rigidity. No distention. Musculoskeletal: Full range of motion to all extremities. No obvious deformities noted Neurologic:  Normal for age. No gross focal neurologic deficits are appreciated.  Skin: Patient has erythema surrounding the nipple with palpable induration.  No appreciable fluctuance.  No discharge from the right nipple. Psychiatric: Mood and affect are normal for age. Speech and behavior are normal.   ____________________________________________    LABS (all labs ordered are listed, but only abnormal results are displayed)  Labs Reviewed  CBC WITH DIFFERENTIAL/PLATELET - Abnormal; Notable for the following components:      Result Value   WBC 13.1 (*)    Neutro Abs 9.7 (*)    All other components within normal limits  COMPREHENSIVE METABOLIC PANEL - Abnormal; Notable for the following components:   Glucose, Bld 105 (*)    All other components within normal limits  SARS CORONAVIRUS 2 (TAT 6-24 HRS)  LACTIC ACID, PLASMA  HIV ANTIBODY (ROUTINE TESTING W REFLEX)  BASIC METABOLIC PANEL  MAGNESIUM  PHOSPHORUS  CBC  PROTIME-INR   ____________________________________________  EKG   ____________________________________________  RADIOLOGY Geraldo Pitter, personally viewed and evaluated these images (plain radiographs) as part of my medical decision making, as well as reviewing the written report by the radiologist.  US BREAST LTD UNI RIGHT INC AXILLA  Result Date: 01/16/2019 CLINICAL DATA:  29 year old female with right nipple abscess. EXAM: LIMITED ULTRASOUND OF THE right BREAST COMPARISON:  None FINDINGS: Targeted sonographic images of the right breast in the region of the clinical concern was performed using grayscale and color Doppler. There is a 3.3 x 2.3 x 2.6 cm lobulated hypoechoic complex collection with liquified appearing areas and solid internal components. This most likely represents a phlegmon or developing abscess. There is hyperemia of the solid components of this collection. This collection is in the retroareolar region of the right breast. IMPRESSION: Right breast retroareolar abscess. Please note this is a limited ultrasound exam of the right breast for evaluation of abscess. This is not a diagnostic exam for other breast pathology. Electronically Signed   By: Elgie Collard M.D.   On: 01/16/2019 19:52    ____________________________________________    PROCEDURES  Procedure(s) performed:      Procedures     Medications  ceFAZolin (ANCEF) IVPB 1 g/50 mL premix (has no administration in time range)  traMADol (ULTRAM) tablet 50 mg (has no administration in time range)  HYDROcodone-acetaminophen (NORCO/VICODIN) 5-325 MG per tablet 1-2 tablet (has no administration in time range)  morphine 2 MG/ML injection 2 mg (has no administration in time range)  docusate sodium (COLACE) capsule 100 mg (has no administration in time range)  ondansetron (ZOFRAN-ODT) disintegrating tablet 4 mg (has no administration in time range)    Or  ondansetron (ZOFRAN) injection 4 mg (has no administration in time range)  lactated ringers infusion (has no administration in time range)  acetaminophen (TYLENOL) tablet 650 mg (has no administration in time range)  cefTRIAXone (ROCEPHIN) 1 g in sodium chloride 0.9 % 100 mL IVPB (0 g Intravenous Stopped 01/16/19 2147)  ALPRAZolam (XANAX) tablet 0.25 mg (0.25 mg Oral Given 01/16/19  2156)     ____________________________________________   INITIAL IMPRESSION / ASSESSMENT AND PLAN / ED COURSE  Pertinent labs & imaging results that were available during my care of the patient were reviewed by me and considered in my medical decision making (see chart for details).      Assessment and plan:  Breast abscess involving nipple. 29 year old female presents to the emergency department with right breast pain, erythema and chills at home for the past 4 days.  Patient had low-grade fever in the emergency department but vital signs were otherwise reassuring.  Patient had leukocytosis on CBC with associated left shift.  Ultrasound of the right breast revealed a 3 cm retroareolar abscess.  General surgery was consulted, Dr. Lysle Pearl who accepted patient for admission.    ____________________________________________  FINAL CLINICAL IMPRESSION(S) / ED DIAGNOSES  Final diagnoses:  Abscess of right nipple      NEW MEDICATIONS STARTED DURING THIS VISIT:  ED  Discharge Orders    None          This chart was dictated using voice recognition software/Dragon. Despite best efforts to proofread, errors can occur which can change the meaning. Any change was purely unintentional.     Karren Cobble 01/16/19 2239    Blake Divine, MD 01/17/19 [phone]

## 2019-01-16 NOTE — ED Notes (Signed)
Recollect lactic sent

## 2019-01-17 ENCOUNTER — Ambulatory Visit
Admit: 2019-01-17 | Discharge: 2019-01-17 | Disposition: A | Discharge status: Home / Self Care | Payer: Self-pay | Attending: Surgery | Admitting: Surgery

## 2019-01-17 LAB — BASIC METABOLIC PANEL
Anion gap: 7 (ref 5–15)
BUN: 8 mg/dL (ref 6–20)
CO2: 22 mmol/L (ref 22–32)
Calcium: 8.8 mg/dL — ABNORMAL LOW (ref 8.9–10.3)
Chloride: 109 mmol/L (ref 98–111)
Creatinine, Ser: 0.57 mg/dL (ref 0.44–1.00)
GFR calc Af Amer: >60 mL/min (ref >60)
GFR calc non Af Amer: >60 mL/min (ref >60)
Glucose, Bld: 97 mg/dL (ref 70–99)
Potassium: 3.7 mmol/L (ref 3.5–5.1)
Sodium: 138 mmol/L (ref 135–145)

## 2019-01-17 LAB — CBC
HCT: 36.7 % (ref 36.0–46.0)
Hemoglobin: 11.8 g/dL — ABNORMAL LOW (ref 12.0–15.0)
MCH: 27.3 pg (ref 26.0–34.0)
MCHC: 32.2 g/dL (ref 30.0–36.0)
MCV: 84.8 fL (ref 80.0–100.0)
Platelets: 298 10*3/uL (ref 150–400)
RBC: 4.33 MIL/uL (ref 3.87–5.11)
RDW: 13.3 % (ref 11.5–15.5)
WBC: 10.1 10*3/uL (ref 4.0–10.5)
nRBC: 0 % (ref 0.0–0.2)

## 2019-01-17 LAB — PROTIME-INR
INR: 1 (ref 0.8–1.2)
Prothrombin Time: 12.6 seconds (ref 11.4–15.2)

## 2019-01-17 LAB — PHOSPHORUS: Phosphorus: 3.3 mg/dL (ref 2.5–4.6)

## 2019-01-17 LAB — HIV ANTIBODY (ROUTINE TESTING W REFLEX): HIV Screen 4th Generation wRfx: NONREACTIVE

## 2019-01-17 LAB — SARS CORONAVIRUS 2 (TAT 6-24 HRS): SARS Coronavirus 2: NEGATIVE

## 2019-01-17 LAB — MAGNESIUM: Magnesium: 2.2 mg/dL (ref 1.7–2.4)

## 2019-01-17 MED ORDER — HYDROCODONE-ACETAMINOPHEN 5-325 MG PO TABS: 1.0000 | ORAL_TABLET | Freq: Four times a day (QID) | ORAL | 0 refills | Status: DC | PRN

## 2019-01-17 MED ORDER — DOCUSATE SODIUM 100 MG PO CAPS: 100.0000 mg | ORAL_CAPSULE | Freq: Two times a day (BID) | ORAL | 0 refills | Status: AC | PRN

## 2019-01-17 MED ORDER — AMOXICILLIN-POT CLAVULANATE 875-125 MG PO TABS: 1.0000 | ORAL_TABLET | Freq: Two times a day (BID) | ORAL | 0 refills | Status: AC

## 2019-01-17 MED ORDER — ACETAMINOPHEN 325 MG PO TABS: 650.0000 mg | ORAL_TABLET | Freq: Three times a day (TID) | ORAL | 0 refills | Status: AC | PRN

## 2019-01-17 MED ORDER — IBUPROFEN 800 MG PO TABS: 800.0000 mg | ORAL_TABLET | Freq: Three times a day (TID) | ORAL | 0 refills | Status: DC | PRN

## 2019-01-17 NOTE — Discharge Instructions (Signed)
US guided breast abscess drainage, Care After This sheet gives you information about how to care for yourself after your procedure. Your health care provider may also give you more specific instructions. If you have problems or questions, contact your health care provider. What can I expect after the procedure? After the procedure, it is common to have:  Soreness.  Bruising.  Itching. Follow these instructions at home: site care Follow instructions from your health care provider about how to take care of your site. Make sure you:  Wash your hands with soap and water before and after you change your bandage (dressing). If soap and water are not available, use hand sanitizer.  If the area bleeds or bruises, apply gentle pressure for 10 minutes.  OK TO SHOWER IN 24HRS  Check your site every day for signs of infection. Check for:  Redness, swelling, or pain.  Fluid or blood.  Warmth.  Pus or a bad smell.  General instructions  Rest and then return to your normal activities as told by your health care provider. .  tylenol and advil as needed for discomfort.  Please alternate between the two every four hours as needed for pain.   .  Use narcotics, if prescribed, only when tylenol and motrin is not enough to control pain. .  325-650mg  every 8hrs to max of 3000mg /24hrs (including the 325mg  in every norco dose) for the tylenol.   .  Advil up to 800mg  per dose every 8hrs as needed for pain.    Keep all follow-up visits as told by your health care provider. This is important. Contact a health care provider if:  You have worsening redness, swelling, or pain around your site.  You have fluid or blood coming from your site.  Your site feels warm to the touch.  You have pus or a bad smell coming from your site.  You have a fever.  Get help right away if:  You have bleeding that does not stop with pressure or a dressing. Summary  After the procedure, it is common to have some  soreness, bruising, and itching at the site.  Check your site every day for signs of infection.  Contact a health care provider if you have redness, swelling, or pain around your site, or your site feels warm to the touch.  Keep all follow-up visits as told by your health care provider. This is important.   This information is not intended to replace advice given to you by your health care provider. Make sure you discuss any questions you have with your health care provider. Document Released: 01/25/2015 Document Revised: 06/28/2017 Document Reviewed: 06/28/2017 Elsevier Interactive Patient Education  01/27/2015.

## 2019-01-17 NOTE — ED Notes (Signed)
Pt signed physical discharge form. 

## 2019-01-17 NOTE — ED Notes (Signed)
Pt returned to ED 31 from [place].

## 2019-01-17 NOTE — ED Notes (Signed)
Signature pad not working. Hard copy of discharge form printed and signed by pt and this RN.

## 2019-01-17 NOTE — ED Notes (Signed)
Patient transported to Ultrasound 

## 2019-01-17 NOTE — Discharge Summary (Signed)
Physician Discharge Summary  Patient ID: Kristina Kim MRN: [id number] DOB/AGE: [birth year] 29 y.o.  Admit date: 01/16/2019 Discharge date: 01/17/2019  Admission Diagnoses: Right breast abscess  Discharge Diagnoses:  Same as above  Discharged Condition: good  Hospital Course: Consulted in the emergency department for issue above.  Arranged ultrasound-guided drainage by radiology.  Abscess was partially drained due to thick consistency and septations.  After extensive discussion with patient regarding the risk benefits and alternatives to outpatient monitoring versus proceeding with a incision and drainage in the operating room, patient opted for outpatient antibiotic management and monitoring to see if this abscess continues to improve.  We will schedule a outpatient follow-up with radiology again in 1 week to reassess the abscess area and then proceed with possible reaspiration if needed.  Patient understands that outpatient treatment may fail, and that she may still require a surgical incision and drainage.  Consults: radiology  Discharge Exam: Blood pressure 137/89, pulse 75, temperature 97.9 F (36.6 C), temperature source Oral, resp. rate 18, height 5' (1.524 m), weight 66.7 kg, last menstrual period 01/06/2019, SpO2 100 %. General appearance: alert, cooperative and no distress Breasts: Right breast with erythema and induration, consistent with abscess.  Less tender than previous.  Disposition:  Discharge disposition: 01-Home or Self Care       Discharge Instructions    Discharge patient   Complete by: As directed    Discharge disposition: 01-Home or Self Care   Discharge patient date: 01/17/2019     Allergies as of 01/17/2019   No Known Allergies     Medication List    TAKE these medications   acetaminophen 325 MG tablet Commonly known as: Tylenol Take 2 tablets (650 mg total) by mouth every 8 (eight) hours as needed for mild pain.   amoxicillin-clavulanate 875-125 MG  tablet Commonly known as: Augmentin Take 1 tablet by mouth 2 (two) times daily for 7 days.   docusate sodium 100 MG capsule Commonly known as: Colace Take 1 capsule (100 mg total) by mouth 2 (two) times daily as needed for up to 10 days for mild constipation.   HYDROcodone-acetaminophen 5-325 MG tablet Commonly known as: Norco Take 1 tablet by mouth every 6 (six) hours as needed for up to 4 doses for moderate pain.   ibuprofen 800 MG tablet Commonly known as: ADVIL Take 1 tablet (800 mg total) by mouth every 8 (eight) hours as needed for mild pain or moderate pain.      Follow-up Information    [place], Andon Villard, DO Follow up.   Specialty: Surgery Why: as needed if any issues until next radiology appointment Contact information: [address] [place] [address] [phone]        [facility]. Schedule an appointment as soon as possible for a visit on 01/23/2019.   Why: re-imaging of breast abscess  Contact information: [phone]           Total time spent arranging discharge was >61min. Signed: Sung Amabile 01/17/2019, 1:02 PM

## 2019-01-18 ENCOUNTER — Other Ambulatory Visit: Payer: Self-pay | Admitting: Surgery

## 2019-01-18 DIAGNOSIS — N611 Abscess of the breast and nipple: Secondary | ICD-10-CM

## 2019-01-20 LAB — AEROBIC/ANAEROBIC CULTURE W GRAM STAIN (SURGICAL/DEEP WOUND)

## 2019-01-23 ENCOUNTER — Ambulatory Visit
Admission: RE | Admit: 2019-01-23 | Discharge: 2019-01-23 | Disposition: A | Discharge status: Home / Self Care | Payer: Self-pay | Source: Ambulatory Visit | Attending: Surgery | Admitting: Surgery

## 2019-01-23 DIAGNOSIS — N611 Abscess of the breast and nipple: Secondary | ICD-10-CM | POA: Insufficient documentation

## 2019-01-26 ENCOUNTER — Other Ambulatory Visit: Payer: Self-pay | Admitting: Surgery

## 2019-01-26 DIAGNOSIS — N611 Abscess of the breast and nipple: Secondary | ICD-10-CM

## 2019-02-18 ENCOUNTER — Encounter: Payer: Self-pay | Admitting: Internal Medicine

## 2019-02-21 ENCOUNTER — Ambulatory Visit
Admission: RE | Admit: 2019-02-21 | Discharge: 2019-02-21 | Disposition: A | Discharge status: Home / Self Care | Payer: Self-pay | Source: Ambulatory Visit | Attending: Surgery | Admitting: Surgery

## 2019-02-21 DIAGNOSIS — N611 Abscess of the breast and nipple: Secondary | ICD-10-CM | POA: Insufficient documentation

## 2019-02-27 ENCOUNTER — Other Ambulatory Visit: Payer: Self-pay

## 2019-02-28 ENCOUNTER — Ambulatory Visit: Payer: Self-pay | Admitting: Surgery

## 2019-02-28 NOTE — H&P (Signed)
Subjective:   CC: Breast abscess [N61.1] s/p US guided needle drainage by IR  HPI:  Kristina Kim is a 29 y.o. female who is here for followup from above.  Feeling better, but still has some persistent discomfort, drainage has resolved a few days after the needle aspiration.  Has completed two rounds of abx.   Current Medications: has a current medication list which includes the following prescription(s): ibuprofen.  Allergies:  No Known Allergies  ROS: General: Denies weight loss, weight gain, fatigue, fevers, chills, and night sweats. Heart: Denies chest pain, palpitations, racing heart, irregular heartbeat, leg pain or swelling, and decreased activity tolerance. Respiratory: Denies breathing difficulty, shortness of breath, wheezing, cough, and sputum. GI: Denies change in appetite, heartburn, nausea, vomiting, constipation, diarrhea, and blood in stool. GU: Denies difficulty urinating, pain with urinating, urgency, frequency, blood in urine    Objective:   BP 122/82   Pulse 76   Ht 152.4 cm (5')   Wt 64.9 kg (143 lb)   BMI 27.93 kg/m   Constitutional :  alert, appears stated age, cooperative and no distress  Gastrointestinal: soft, non-tender; bowel sounds normal; no masses,  no organomegaly.    Musculoskeletal: Steady gait and movement  Skin: Cool and moist.    Psychiatric: Normal affect, non-agitated, not confused  Breast: Chaperone present for exam.  Scab noted at [facility] position, no active drainge.  Palpable induration/healing induration in retroareolar region, slightly TTP.      LABS:  N/A   RADS: CLINICAL DATA: Short-term follow-up for left breast abscess. The patient states she has had 2 courses of antibiotics, with the most recent course of antibiotics completed nearly 2 weeks ago. She states the wound is still draining. Denies any fevers. Attempt at cyst aspiration 01/17/2019 yielded only a small amount of purulent fluid due to thick nature and  multiple septations.  EXAM: ULTRASOUND OF THE LEFT BREAST  COMPARISON: Previous exam(s).  FINDINGS: Physical examination reveals mild erythema involving the periareolar right breast with an open wound present slightly medially along the areola.  Targeted ultrasound of the right breast was performed. There is a heterogeneous collection in the retroareolar right breast measuring up to 3.1 x 1.5 x 3.3 cm, slightly decreased in size when compared to the prior exam, previously measuring 3.5 x 1.8 x 3 cm when remeasured in a similar fashion.  IMPRESSION: Persistent right breast abscess, slightly decreased in size when compared to prior ultrasound dated 01/23/2019.  RECOMMENDATION: 1. Given persistent abscess in the retroareolar right breast with continued drainage, surgical consultation is recommended for possible I&D.  2. Recommend the patient return in approximately 4-6 weeks for an additional right breast ultrasound to ensure resolution/continued improvement of the findings in the retroareolar right breast.  I have discussed the findings and recommendations with the patient. If applicable, a reminder letter will be sent to the patient regarding the next appointment.  BI-RADS CATEGORY 3: Probably benign.   Electronically Signed  By: Edwin Cap M.D.  On: 02/21/2019 15:19  Assessment:      Breast abscess [N61.1]  Plan:   1. Seems to be clinically improving but patient requested formal exploration and completion I&D in OR to make sure this episode has fully resolved.    Discussed the risk of surgery including recurrence, chronic pain, post-op infxn, poor/delayed wound healing, poor cosmesis, seroma, hematoma formation, and possible re-operation to address said risks. The risks of general anesthetic, if used, includes MI, CVA, sudden death or even reaction to anesthetic medications  also discussed.  Typical post-op recovery time and possbility of activity  restrictions were also discussed.  Alternatives include continued observation.  Benefits include possible symptom relief.  The patient verbalized understanding and all questions were answered to the patient's satisfaction.

## 2019-02-28 NOTE — H&P (View-Only) (Signed)
Subjective:   CC: Breast abscess [N61.1] s/p US guided needle drainage by IR  HPI:  Kristina Kim is a 29 y.o. female who is here for followup from above.  Feeling better, but still has some persistent discomfort, drainage has resolved a few days after the needle aspiration.  Has completed two rounds of abx.   Current Medications: has a current medication list which includes the following prescription(s): ibuprofen.  Allergies:  No Known Allergies  ROS: General: Denies weight loss, weight gain, fatigue, fevers, chills, and night sweats. Heart: Denies chest pain, palpitations, racing heart, irregular heartbeat, leg pain or swelling, and decreased activity tolerance. Respiratory: Denies breathing difficulty, shortness of breath, wheezing, cough, and sputum. GI: Denies change in appetite, heartburn, nausea, vomiting, constipation, diarrhea, and blood in stool. GU: Denies difficulty urinating, pain with urinating, urgency, frequency, blood in urine    Objective:   BP 122/82   Pulse 76   Ht 152.4 cm (5')   Wt 64.9 kg (143 lb)   BMI 27.93 kg/m   Constitutional :  alert, appears stated age, cooperative and no distress  Gastrointestinal: soft, non-tender; bowel sounds normal; no masses,  no organomegaly.    Musculoskeletal: Steady gait and movement  Skin: Cool and moist.    Psychiatric: Normal affect, non-agitated, not confused  Breast: Chaperone present for exam.  Scab noted at [facility] position, no active drainge.  Palpable induration/healing induration in retroareolar region, slightly TTP.      LABS:  N/A   RADS: CLINICAL DATA: Short-term follow-up for left breast abscess. The patient states she has had 2 courses of antibiotics, with the most recent course of antibiotics completed nearly 2 weeks ago. She states the wound is still draining. Denies any fevers. Attempt at cyst aspiration 01/17/2019 yielded only a small amount of purulent fluid due to thick nature and  multiple septations.  EXAM: ULTRASOUND OF THE LEFT BREAST  COMPARISON: Previous exam(s).  FINDINGS: Physical examination reveals mild erythema involving the periareolar right breast with an open wound present slightly medially along the areola.  Targeted ultrasound of the right breast was performed. There is a heterogeneous collection in the retroareolar right breast measuring up to 3.1 x 1.5 x 3.3 cm, slightly decreased in size when compared to the prior exam, previously measuring 3.5 x 1.8 x 3 cm when remeasured in a similar fashion.  IMPRESSION: Persistent right breast abscess, slightly decreased in size when compared to prior ultrasound dated 01/23/2019.  RECOMMENDATION: 1. Given persistent abscess in the retroareolar right breast with continued drainage, surgical consultation is recommended for possible I&D.  2. Recommend the patient return in approximately 4-6 weeks for an additional right breast ultrasound to ensure resolution/continued improvement of the findings in the retroareolar right breast.  I have discussed the findings and recommendations with the patient. If applicable, a reminder letter will be sent to the patient regarding the next appointment.  BI-RADS CATEGORY 3: Probably benign.   Electronically Signed  By: Edwin Cap M.D.  On: 02/21/2019 15:19  Assessment:      Breast abscess [N61.1]  Plan:   1. Seems to be clinically improving but patient requested formal exploration and completion I&D in OR to make sure this episode has fully resolved.    Discussed the risk of surgery including recurrence, chronic pain, post-op infxn, poor/delayed wound healing, poor cosmesis, seroma, hematoma formation, and possible re-operation to address said risks. The risks of general anesthetic, if used, includes MI, CVA, sudden death or even reaction to anesthetic medications  also discussed.  Typical post-op recovery time and possbility of activity  restrictions were also discussed.  Alternatives include continued observation.  Benefits include possible symptom relief.  The patient verbalized understanding and all questions were answered to the patient's satisfaction.   

## 2019-03-07 ENCOUNTER — Other Ambulatory Visit: Payer: Self-pay

## 2019-03-07 ENCOUNTER — Encounter
Admission: RE | Admit: 2019-03-07 | Discharge: 2019-03-07 | Disposition: A | Discharge status: Home / Self Care | Payer: HRSA Program | Source: Ambulatory Visit | Attending: Surgery | Admitting: Surgery

## 2019-03-07 DIAGNOSIS — Z20822 Contact with and (suspected) exposure to covid-19: Secondary | ICD-10-CM | POA: Insufficient documentation

## 2019-03-07 DIAGNOSIS — Z01812 Encounter for preprocedural laboratory examination: Secondary | ICD-10-CM | POA: Insufficient documentation

## 2019-03-07 HISTORY — DX: Unspecified asthma, uncomplicated: J45.909

## 2019-03-07 HISTORY — DX: Anxiety disorder, unspecified: F41.9

## 2019-03-07 HISTORY — DX: Other seasonal allergic rhinitis: J30.2

## 2019-03-07 NOTE — Patient Instructions (Signed)
Your procedure is scheduled on: Friday 2/26 Report to Day Surgery. To find out your arrival time please call [phone] between 1PM - 3PM on Thurs. 2/25  Remember: Instructions that are not followed completely may result in serious medical risk,  up to and including death, or upon the discretion of your surgeon and anesthesiologist your  surgery may need to be rescheduled.     _X__ 1. Do not eat food after midnight the night before your procedure.                 No gum chewing or hard candies. You may drink clear liquids up to 2 hours                 before you are scheduled to arrive for your surgery- DO not drink clear                 liquids within 2 hours of the start of your surgery.                 Clear Liquids include:  water, apple juice without pulp, clear Gatorade, G2 or                  Gatorade Zero (avoid Red/Purple/Blue), Black Coffee or Tea (Do not add                 anything to coffee or tea). _____2.   Complete the carbohydrate drink provided to you, 2 hours before arrival.  __X__2.  On the morning of surgery brush your teeth with toothpaste and water, you                may rinse your mouth with mouthwash if you wish.  Do not swallow any toothpaste of mouthwash.     _X__ 3.  No Alcohol for 24 hours before or after surgery.   ___ 4.  Do Not Smoke or use e-cigarettes For 24 Hours Prior to Your Surgery.                 Do not use any chewable tobacco products for at least 6 hours prior to                 surgery.  ____  5.  Bring all medications with you on the day of surgery if instructed.   _x___  6.  Notify your doctor if there is any change in your medical condition      (cold, fever, infections).     Do not wear jewelry, make-up, hairpins, clips or nail polish. Do not wear lotions, powders, or perfumes. . Do not shave 48 hours prior to surgery. Men may shave face and neck. Do not bring valuables to the hospital.    [facility] is  not responsible for any belongings or valuables.  Contacts, dentures or bridgework may not be worn into surgery. Leave your suitcase in the car. After surgery it may be brought to your room. For patients admitted to the hospital, discharge time is determined by your treatment team.   Patients discharged the day of surgery will not be allowed to drive home.   Make arrangements for someone to be with you for the first 24 hours of your Same Day Discharge.    Please read over the following fact sheets that you were given:       _x___ Take these medicines the morning of surgery with A SIP OF WATER:  1. May take tylenol or allergy medication if needed  2.   3.   4.  5.  6.  ____ Fleet Enema (as directed)   _x___ Use CHG Soap (or wipes) as directed  ____ Use Benzoyl Peroxide Gel as instructed  ____ Use inhalers on the day of surgery  ____ Stop metformin 2 days prior to surgery    ____ Take 1/2 of usual insulin dose the night before surgery. No insulin the morning          of surgery.   ____ Stop Coumadin/Plavix/aspirin on  __x__ Stop Anti-inflammatories ibuprofen aleve or aspirin today.  May resume after surgery   ____ Stop supplements until after surgery.    ____ Bring C-Pap to the hospital.   1 part alcohol 2 parts water in small ziplock bag. (may want to double bag) Place in freezer. Wrap in cloth and place in bra. Wear bra (no underwire)  day and night for a few days.

## 2019-03-08 ENCOUNTER — Other Ambulatory Visit
Admission: RE | Admit: 2019-03-08 | Discharge: 2019-03-08 | Disposition: A | Discharge status: Home / Self Care | Payer: HRSA Program | Source: Ambulatory Visit | Attending: Surgery | Admitting: Surgery

## 2019-03-08 ENCOUNTER — Other Ambulatory Visit: Payer: Self-pay | Admitting: Surgery

## 2019-03-08 DIAGNOSIS — Z20822 Contact with and (suspected) exposure to covid-19: Secondary | ICD-10-CM | POA: Diagnosis not present

## 2019-03-08 DIAGNOSIS — Z01812 Encounter for preprocedural laboratory examination: Secondary | ICD-10-CM | POA: Diagnosis present

## 2019-03-08 DIAGNOSIS — N6320 Unspecified lump in the left breast, unspecified quadrant: Secondary | ICD-10-CM

## 2019-03-08 LAB — SARS CORONAVIRUS 2 (TAT 6-24 HRS): SARS Coronavirus 2: NEGATIVE

## 2019-03-10 ENCOUNTER — Ambulatory Visit: Payer: Self-pay | Admitting: Anesthesiology

## 2019-03-10 ENCOUNTER — Encounter: Payer: Self-pay | Admitting: Surgery

## 2019-03-10 ENCOUNTER — Ambulatory Visit
Admission: RE | Admit: 2019-03-10 | Discharge: 2019-03-10 | Disposition: A | Discharge status: Home / Self Care | Payer: Self-pay | Attending: Surgery | Admitting: Surgery

## 2019-03-10 ENCOUNTER — Other Ambulatory Visit: Payer: Self-pay

## 2019-03-10 ENCOUNTER — Encounter
Admission: RE | Disposition: A | Discharge status: Home / Self Care | Payer: Self-pay | Source: Home / Self Care | Attending: Surgery

## 2019-03-10 DIAGNOSIS — J45909 Unspecified asthma, uncomplicated: Secondary | ICD-10-CM | POA: Insufficient documentation

## 2019-03-10 DIAGNOSIS — N611 Abscess of the breast and nipple: Secondary | ICD-10-CM | POA: Insufficient documentation

## 2019-03-10 DIAGNOSIS — F419 Anxiety disorder, unspecified: Secondary | ICD-10-CM | POA: Insufficient documentation

## 2019-03-10 DIAGNOSIS — Z791 Long term (current) use of non-steroidal anti-inflammatories (NSAID): Secondary | ICD-10-CM | POA: Insufficient documentation

## 2019-03-10 HISTORY — PX: INCISION AND DRAINAGE ABSCESS: SHX5864

## 2019-03-10 LAB — URINE DRUG SCREEN, QUALITATIVE (ARMC ONLY)
Amphetamines, Ur Screen: NOT DETECTED
Barbiturates, Ur Screen: NOT DETECTED
Benzodiazepine, Ur Scrn: NOT DETECTED
Cannabinoid 50 Ng, Ur ~~LOC~~: POSITIVE — AB
Cocaine Metabolite,Ur ~~LOC~~: NOT DETECTED
MDMA (Ecstasy)Ur Screen: NOT DETECTED
Methadone Scn, Ur: NOT DETECTED
Opiate, Ur Screen: NOT DETECTED
Phencyclidine (PCP) Ur S: NOT DETECTED
Tricyclic, Ur Screen: NOT DETECTED

## 2019-03-10 LAB — POCT PREGNANCY, URINE: Preg Test, Ur: NEGATIVE

## 2019-03-10 SURGERY — INCISION AND DRAINAGE, ABSCESS
Anesthesia: General | Site: Breast | Laterality: Right

## 2019-03-10 MED ORDER — GLYCOPYRROLATE 0.2 MG/ML IJ SOLN
INTRAMUSCULAR | Status: DC | PRN
  Administered 2019-03-10: .2 mg via INTRAVENOUS

## 2019-03-10 MED ORDER — BUPIVACAINE HCL (PF) 0.5 % IJ SOLN
INTRAMUSCULAR | Status: AC
  Filled 2019-03-10: qty 30

## 2019-03-10 MED ORDER — ACETAMINOPHEN 325 MG PO TABS: 650.0000 mg | ORAL_TABLET | Freq: Three times a day (TID) | ORAL | 0 refills | Status: AC | PRN

## 2019-03-10 MED ORDER — CEFAZOLIN SODIUM-DEXTROSE 2-4 GM/100ML-% IV SOLN
INTRAVENOUS | Status: AC
  Filled 2019-03-10: qty 100

## 2019-03-10 MED ORDER — FENTANYL CITRATE (PF) 100 MCG/2ML IJ SOLN
INTRAMUSCULAR | Status: AC
  Filled 2019-03-10: qty 2

## 2019-03-10 MED ORDER — BUPIVACAINE HCL (PF) 0.25 % IJ SOLN
INTRAMUSCULAR | Status: AC
  Filled 2019-03-10: qty 30

## 2019-03-10 MED ORDER — FAMOTIDINE 20 MG PO TABS
ORAL_TABLET | ORAL | Status: AC
  Administered 2019-03-10: 06:00:00 20 mg via ORAL
  Filled 2019-03-10: qty 1

## 2019-03-10 MED ORDER — LACTATED RINGERS IV SOLN: INTRAVENOUS | Status: DC

## 2019-03-10 MED ORDER — FENTANYL CITRATE (PF) 100 MCG/2ML IJ SOLN: 25.0000 ug | INTRAMUSCULAR | Status: DC | PRN

## 2019-03-10 MED ORDER — LACTATED RINGERS IV SOLN: INTRAVENOUS | Status: DC | PRN

## 2019-03-10 MED ORDER — PROPOFOL 10 MG/ML IV BOLUS
INTRAVENOUS | Status: AC
  Filled 2019-03-10: qty 20

## 2019-03-10 MED ORDER — BUPIVACAINE-EPINEPHRINE 0.25% -1:200000 IJ SOLN
INTRAMUSCULAR | Status: DC | PRN
  Administered 2019-03-10: 2 mL

## 2019-03-10 MED ORDER — MIDAZOLAM HCL 2 MG/2ML IJ SOLN
INTRAMUSCULAR | Status: DC | PRN
  Administered 2019-03-10: 2 mg via INTRAVENOUS

## 2019-03-10 MED ORDER — FAMOTIDINE 20 MG PO TABS: 20.0000 mg | ORAL_TABLET | Freq: Once | ORAL | Status: AC

## 2019-03-10 MED ORDER — CEFAZOLIN SODIUM-DEXTROSE 2-4 GM/100ML-% IV SOLN
2.0000 g | INTRAVENOUS | Status: AC
  Administered 2019-03-10: 08:00:00 2 g via INTRAVENOUS

## 2019-03-10 MED ORDER — HYDROCODONE-ACETAMINOPHEN 5-325 MG PO TABS: 1.0000 | ORAL_TABLET | Freq: Four times a day (QID) | ORAL | 0 refills | Status: DC | PRN

## 2019-03-10 MED ORDER — DEXAMETHASONE SODIUM PHOSPHATE 10 MG/ML IJ SOLN
INTRAMUSCULAR | Status: DC | PRN
  Administered 2019-03-10: 10 mg via INTRAVENOUS

## 2019-03-10 MED ORDER — LIDOCAINE HCL (CARDIAC) PF 100 MG/5ML IV SOSY
PREFILLED_SYRINGE | INTRAVENOUS | Status: DC | PRN
  Administered 2019-03-10: 80 mg via INTRAVENOUS

## 2019-03-10 MED ORDER — OXYCODONE HCL 5 MG/5ML PO SOLN: 5.0000 mg | Freq: Once | ORAL | Status: DC | PRN

## 2019-03-10 MED ORDER — DOCUSATE SODIUM 100 MG PO CAPS: 100.0000 mg | ORAL_CAPSULE | Freq: Two times a day (BID) | ORAL | 0 refills | Status: AC | PRN

## 2019-03-10 MED ORDER — FENTANYL CITRATE (PF) 100 MCG/2ML IJ SOLN
INTRAMUSCULAR | Status: DC | PRN
  Administered 2019-03-10 (×2): 25 ug via INTRAVENOUS

## 2019-03-10 MED ORDER — LIDOCAINE HCL 1 % IJ SOLN
INTRAMUSCULAR | Status: DC | PRN
  Administered 2019-03-10: 2 mL

## 2019-03-10 MED ORDER — PROPOFOL 10 MG/ML IV BOLUS
INTRAVENOUS | Status: DC | PRN
  Administered 2019-03-10: 170 mg via INTRAVENOUS
  Administered 2019-03-10: 30 mg via INTRAVENOUS

## 2019-03-10 MED ORDER — ONDANSETRON HCL 4 MG/2ML IJ SOLN
INTRAMUSCULAR | Status: DC | PRN
  Administered 2019-03-10: 4 mg via INTRAVENOUS

## 2019-03-10 MED ORDER — EPINEPHRINE PF 1 MG/ML IJ SOLN
INTRAMUSCULAR | Status: AC
  Filled 2019-03-10: qty 1

## 2019-03-10 MED ORDER — IBUPROFEN 800 MG PO TABS: 800.0000 mg | ORAL_TABLET | Freq: Three times a day (TID) | ORAL | 0 refills | Status: DC | PRN

## 2019-03-10 MED ORDER — MIDAZOLAM HCL 2 MG/2ML IJ SOLN
INTRAMUSCULAR | Status: AC
  Filled 2019-03-10: qty 2

## 2019-03-10 MED ORDER — PROMETHAZINE HCL 25 MG/ML IJ SOLN: 6.2500 mg | INTRAMUSCULAR | Status: DC | PRN

## 2019-03-10 MED ORDER — CHLORHEXIDINE GLUCONATE CLOTH 2 % EX PADS
6.0000 | MEDICATED_PAD | Freq: Once | CUTANEOUS | Status: AC
  Administered 2019-03-10: 06:00:00 6 via TOPICAL

## 2019-03-10 MED ORDER — LIDOCAINE HCL (PF) 1 % IJ SOLN
INTRAMUSCULAR | Status: AC
  Filled 2019-03-10: qty 30

## 2019-03-10 MED ORDER — OXYCODONE HCL 5 MG PO TABS: 5.0000 mg | ORAL_TABLET | Freq: Once | ORAL | Status: DC | PRN

## 2019-03-10 SURGICAL SUPPLY — 27 items
BLADE SURG 15 STRL LF DISP TIS (BLADE) ×1 IMPLANT
BLADE SURG 15 STRL SS (BLADE) ×1
CANISTER SUCT 1200ML W/VALVE (MISCELLANEOUS) ×2 IMPLANT
CHLORAPREP W/TINT 26 (MISCELLANEOUS) ×2 IMPLANT
COVER WAND RF STERILE (DRAPES) ×2 IMPLANT
DRAPE LAPAROTOMY 100X77 ABD (DRAPES) ×2 IMPLANT
ELECT REM PT RETURN 9FT ADLT (ELECTROSURGICAL) ×2
ELECTRODE REM PT RTRN 9FT ADLT (ELECTROSURGICAL) ×1 IMPLANT
GAUZE PACKING IODOFORM 1/2 (PACKING) ×2 IMPLANT
GAUZE SPONGE 4X4 12PLY STRL (GAUZE/BANDAGES/DRESSINGS) ×2 IMPLANT
GLOVE BIOGEL PI IND STRL 7.0 (GLOVE) ×1 IMPLANT
GLOVE BIOGEL PI INDICATOR 7.0 (GLOVE) ×1
GLOVE SURG SYN 6.5 ES PF (GLOVE) ×4 IMPLANT
GOWN STRL REUS W/ TWL LRG LVL3 (GOWN DISPOSABLE) ×2 IMPLANT
GOWN STRL REUS W/TWL LRG LVL3 (GOWN DISPOSABLE) ×2
LABEL OR SOLS (LABEL) ×2 IMPLANT
NEEDLE HYPO 22GX1.5 SAFETY (NEEDLE) ×2 IMPLANT
NS IRRIG 500ML POUR BTL (IV SOLUTION) ×2 IMPLANT
PACK BASIN MINOR ARMC (MISCELLANEOUS) ×2 IMPLANT
SUT MNCRL 4-0 (SUTURE) ×1
SUT MNCRL 4-0 27XMFL (SUTURE) ×1
SUT VIC AB 2-0 CT2 27 (SUTURE) ×2 IMPLANT
SUT VIC AB 3-0 SH 27 (SUTURE) ×1
SUT VIC AB 3-0 SH 27X BRD (SUTURE) ×1 IMPLANT
SUTURE MNCRL 4-0 27XMF (SUTURE) ×1 IMPLANT
SYR 10ML LL (SYRINGE) ×2 IMPLANT
TOWEL OR 17X26 4PK STRL BLUE (TOWEL DISPOSABLE) ×2 IMPLANT

## 2019-03-10 NOTE — Interval H&P Note (Signed)
History and Physical Interval Note:  03/10/2019 7:17 AM  Kristina Kim  has presented today for surgery, with the diagnosis of N61.1 Breast Abscess.  The various methods of treatment have been discussed with the patient and family. After consideration of risks, benefits and other options for treatment, the patient has consented to  Procedure(s): INCISION AND DRAINAGE ABSCESS (Right) as a surgical intervention.  The patient's history has been reviewed, patient examined heart and lung exam wnl, no change in status, stable for surgery.  I have reviewed the patient's chart and labs.  Questions were answered to the patient's satisfaction.     Rande Dario Tonna Boehringer

## 2019-03-10 NOTE — Anesthesia Postprocedure Evaluation (Signed)
Anesthesia Post Note  Patient: Kristina Kim  Procedure(s) Performed: INCISION AND DRAINAGE ABSCESS (Right Breast)  Patient location during evaluation: PACU Anesthesia Type: General Level of consciousness: awake and alert Pain management: pain level controlled Vital Signs Assessment: post-procedure vital signs reviewed and stable Respiratory status: spontaneous breathing, nonlabored ventilation and respiratory function stable Cardiovascular status: blood pressure returned to baseline and stable Postop Assessment: no apparent nausea or vomiting Anesthetic complications: no     Last Vitals:  Vitals:   03/10/19 0845 03/10/19 0910  BP: 119/72 129/71  Pulse: 68 63  Resp: 18 16  Temp: (!) 36.3 C   SpO2: 100% 100%    Last Pain:  Vitals:   03/10/19 0910  TempSrc:   PainSc: 0-No pain                 Karleen Hampshire

## 2019-03-10 NOTE — Anesthesia Preprocedure Evaluation (Addendum)
Anesthesia Evaluation  Patient identified by MRN, date of birth, ID band Patient awake    Reviewed: Allergy & Precautions, H&P , NPO status , Patient's Chart, lab work & pertinent test results  Airway Mallampati: II  TM Distance: >3 FB Neck ROM: full    Dental  (+) Teeth Intact   Pulmonary asthma ,           Cardiovascular (-) anginanegative cardio ROS  (-) dysrhythmias      Neuro/Psych Anxiety negative neurological ROS  negative psych ROS   GI/Hepatic negative GI ROS, Neg liver ROS,   Endo/Other  negative endocrine ROS  Renal/GU      Musculoskeletal   Abdominal   Peds  Hematology negative hematology ROS (+)   Anesthesia Other Findings Past Medical History: No date: Abnormal Pap smear of cervix No date: Anxiety No date: Asthma     Comment:  mild, no inhalers No date: Seasonal allergies  Past Surgical History: 2015: COLPOSCOPY     Comment:  abnormal Pap [facility]  BMI    Body Mass Index: 27.93 kg/m      Reproductive/Obstetrics negative OB ROS                            Anesthesia Physical Anesthesia Plan  ASA: II  Anesthesia Plan: General LMA   Post-op Pain Management:    Induction:   PONV Risk Score and Plan: Dexamethasone, Ondansetron, Midazolam and Treatment may vary due to age or medical condition  Airway Management Planned:   Additional Equipment:   Intra-op Plan:   Post-operative Plan:   Informed Consent: I have reviewed the patients History and Physical, chart, labs and discussed the procedure including the risks, benefits and alternatives for the proposed anesthesia with the patient or authorized representative who has indicated his/her understanding and acceptance.     Dental Advisory Given  Plan Discussed with: Anesthesiologist  Anesthesia Plan Comments:         Anesthesia Quick Evaluation

## 2019-03-10 NOTE — Op Note (Signed)
Preoperative diagnosis: Right breast abscess Postoperative diagnosis: same  Procedure: Incision and drainage of right breast abscess  Anesthesia: GETA  Surgeon: Tonna Boehringer  Wound Classification: Contaminated  Indications: Patient is a 29 y.o. female  presented with above.  See H&P for further details.  Specimen: Right breast abscess wound culture  Complications: None  Estimated Blood Loss: 5 mL  Findings:  1.  Persistent right breast abscess with purulent drainage. 2. purulent secretions drained and cultured 3. Adequate hemostasis.   Description of procedure: The patient was placed in the spine position and GETA anesthesia was induced. The area was prepped and draped in the usual sterile fashion. A timeout was completed verifying correct patient, procedure, site, positioning, and implant(s) and/or special equipment prior to beginning this procedure.  Local infused over planned incision site, along areolar border where there he was still a visibly healing former abscess site drainage.  Inicision made and approximately 10 mL of purulent secretions were immediately drained. Cultures taken.  With a hemostat blunt dissection of septas performed to drain the abscess completely.  Overall abscess cavity measuring roughly 1 cm x 1 cm x 2 cm.  The wound then irrigated, hemostasis confirmed and then packed with an iodine packing, dressed with 4 x 4 gauze and secured with paper tape.  The patient tolerated the procedure well and was taken to the postanesthesia care unit in satisfactory condition.

## 2019-03-10 NOTE — Transfer of Care (Signed)
Immediate Anesthesia Transfer of Care Note  Patient: Kristina Kim  Procedure(s) Performed: INCISION AND DRAINAGE ABSCESS (Right Breast)  Patient Location: PACU  Anesthesia Type:General  Level of Consciousness: drowsy, patient cooperative and responds to stimulation  Airway & Oxygen Therapy: Patient Spontanous Breathing and Patient connected to face mask oxygen  Post-op Assessment: Report given to RN and Post -op Vital signs reviewed and stable  Post vital signs: Reviewed and stable  Last Vitals:  Vitals Value Taken Time  BP 117/73 03/10/19 0803  Temp    Pulse 88 03/10/19 0806  Resp    SpO2 100 % 03/10/19 0806  Vitals shown include unvalidated device data.  Last Pain:  Vitals:   03/10/19 0618  TempSrc: Tympanic  PainSc: 0-No pain         Complications: No apparent anesthesia complications

## 2019-03-10 NOTE — Discharge Instructions (Addendum)
Removal, Care After This sheet gives you information about how to care for yourself after your procedure. Your health care provider may also give you more specific instructions. If you have problems or questions, contact your health care provider. What can I expect after the procedure? After the procedure, it is common to have:  Soreness.  Bruising.  Itching. Follow these instructions at home: site care Follow instructions from your health care provider about how to take care of your site. Make sure you:  Wash your hands with soap and water before and after you change your bandage (dressing). If soap and water are not available, use hand sanitizer. LEAVE PACKING INTACT FOR 48HRS, THEN OK TO REMOVE AND SHOWER.   Take tip of 4x4 gauze and use a qtip to gently pack the wound as tolerated. Keep wound covered by taping down the remaing portion of the 4x4 gauze over the wound. Remove before showering and allow soap and water to run over it.  Pat dry and then repack.  Check your site every day for signs of infection. Check for:  Redness, swelling, or pain.  Fluid or blood.  Warmth.  Pus or a bad smell.  General instructions  Rest and then return to your normal activities as told by your health care provider. .  tylenol and advil as needed for discomfort.  Please alternate between the two every four hours as needed for pain.   .  Use narcotics, if prescribed, only when tylenol and motrin is not enough to control pain. .  325-650mg  every 8hrs to max of 3000mg /24hrs (including the 325mg  in every norco dose) for the tylenol.   .  Advil up to 800mg  per dose every 8hrs as needed for pain.    Keep all follow-up visits as told by your health care provider. This is important. Contact a health care provider if:  You have redness, swelling, or pain around your site.  You have fluid or blood coming from your site.  Your site feels warm to the touch.  You have pus or a bad smell coming from  your site.  You have a fever.  Your sutures, skin glue, or adhesive strips loosen or come off sooner than expected. Get help right away if:  You have bleeding that does not stop with pressure or a dressing. Summary  After the procedure, it is common to have some soreness, bruising, and itching at the site.  Follow instructions from your health care provider about how to take care of your site.  Check your site every day for signs of infection.  Contact a health care provider if you have redness, swelling, or pain around your site, or your site feels warm to the touch.  Keep all follow-up visits as told by your health care provider. This is important. This information is not intended to replace advice given to you by your health care provider. Make sure you discuss any questions you have with your health care provider. Document Released: 01/25/2015 Document Revised: 06/28/2017 Document Reviewed: 06/28/2017 Elsevier Interactive Patient Education  2019 [employer]  AMBULATORY SURGERY  DISCHARGE INSTRUCTIONS   1) The drugs that you were given will stay in your system until tomorrow so for the next 24 hours you should not:  A) Drive an automobile B) Make any legal decisions C) Drink any alcoholic beverage   2) You may resume regular meals tomorrow.  Today it is better to start with liquids and gradually work up to solid foods.  You may eat anything you prefer, but it is better to start with liquids, then soup and crackers, and gradually work up to solid foods.   3) Please notify your doctor immediately if you have any unusual bleeding, trouble breathing, redness and pain at the surgery site, drainage, fever, or pain not relieved by medication.    4) Additional Instructions:        Please contact your physician with any problems or Same Day Surgery at [phone], Monday through Friday 6 am to 4 pm, or [place] at [facility] number at [phone].

## 2019-03-10 NOTE — Anesthesia Procedure Notes (Signed)
Procedure Name: LMA Insertion Performed by: Mohammed Kindle, CRNA Pre-anesthesia Checklist: Patient identified, Emergency Drugs available, Suction available and Patient being monitored Patient Re-evaluated:Patient Re-evaluated prior to induction Oxygen Delivery Method: Circle system utilized Preoxygenation: Pre-oxygenation with 100% oxygen Induction Type: IV induction Ventilation: Mask ventilation without difficulty LMA: LMA inserted LMA Size: 4.0 Tube type: Oral Placement Confirmation: positive ETCO2,  CO2 detector and breath sounds checked- equal and bilateral Tube secured with: Tape Dental Injury: Teeth and Oropharynx as per pre-operative assessment

## 2019-03-15 LAB — AEROBIC/ANAEROBIC CULTURE W GRAM STAIN (SURGICAL/DEEP WOUND): Culture: NO GROWTH

## 2019-07-03 ENCOUNTER — Other Ambulatory Visit: Payer: Self-pay | Admitting: Surgery

## 2019-07-03 DIAGNOSIS — N611 Abscess of the breast and nipple: Secondary | ICD-10-CM

## 2019-07-06 ENCOUNTER — Ambulatory Visit
Admission: RE | Admit: 2019-07-06 | Discharge: 2019-07-06 | Disposition: A | Discharge status: Home / Self Care | Payer: Self-pay | Source: Ambulatory Visit | Attending: Surgery | Admitting: Surgery

## 2019-07-06 DIAGNOSIS — N611 Abscess of the breast and nipple: Secondary | ICD-10-CM | POA: Insufficient documentation

## 2019-10-03 ENCOUNTER — Other Ambulatory Visit: Payer: Self-pay

## 2019-10-03 ENCOUNTER — Ambulatory Visit: Payer: Self-pay | Admitting: Surgery

## 2019-10-03 ENCOUNTER — Other Ambulatory Visit
Admission: RE | Admit: 2019-10-03 | Discharge: 2019-10-03 | Disposition: A | Discharge status: Home / Self Care | Payer: HRSA Program | Source: Ambulatory Visit | Attending: Surgery | Admitting: Surgery

## 2019-10-03 DIAGNOSIS — Z20822 Contact with and (suspected) exposure to covid-19: Secondary | ICD-10-CM | POA: Insufficient documentation

## 2019-10-03 DIAGNOSIS — Z01812 Encounter for preprocedural laboratory examination: Secondary | ICD-10-CM | POA: Diagnosis present

## 2019-10-03 NOTE — H&P (View-Only) (Signed)
Subjective:   CC: Chronic abscess of breast [N61.1]   HPI:  Laquita Schumacher is a 29 y.o. female who is here for followup from above.  Started noticing increasing swelling and discomfort around previous I&D site again. Spontaneous drainage starting last night, still painful and draining.  Area was completely fine until this latest episode, started few days ago.     Current Medications: has a current medication list which includes the following prescription(s): acetaminophen and ibuprofen.  Allergies:  No Known Allergies  ROS: General: Denies weight loss, weight gain, fatigue, fevers, chills, and night sweats. Heart: Denies chest pain, palpitations, racing heart, irregular heartbeat, leg pain or swelling, and decreased activity tolerance. Respiratory: Denies breathing difficulty, shortness of breath, wheezing, cough, and sputum. GI: Denies change in appetite, heartburn, nausea, vomiting, constipation, diarrhea, and blood in stool. GU: Denies difficulty urinating, pain with urinating, urgency, frequency, blood in urine    Objective:     BP 128/83   Pulse 79   Ht 152.4 cm (5')   Wt 64.9 kg (143 lb)   BMI 27.93 kg/m   Constitutional :  alert, appears stated age, cooperative and no distress  Musculoskeletal: Steady gait and movement  Skin: Cool and moist    Psychiatric: Normal affect, non-agitated, not confused  Breast:   Right breast periaeroloar incision site open again 1cm x 1cm opening with red granluation tissue and moderate purulent drainage.  Surrounding firm tissue, TTP, with erythema.    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  Plan:     1. Actively draining and recurring in same spot again.  Will need a formal debridement and tissue sampling of the abscess cavity to see if there is a reason why this keeps recurring in same spot in a otherwise healthy individual  with no specific risk factors for recurrent soft tissue infections.  Discussed surgical excision.  Alternatives include continued observation with abx.  Benefits include possible symptom relief, pathologic evaluation. Discussed the risk of surgery including recurrence, chronic pain,poor cosmesis, poor/delayed wound healing, and possible re-operation to address said risks. The risks of general anesthetic, if used, includes MI, CVA, sudden death or even reaction to anesthetic medications also discussed.   Pt verbalized understanding and all questions addressed.      

## 2019-10-03 NOTE — H&P (Signed)
Subjective:   CC: Chronic abscess of breast [N61.1]   HPI:  Kristina Kim is a 29 y.o. female who is here for followup from above.  Started noticing increasing swelling and discomfort around previous I&D site again. Spontaneous drainage starting last night, still painful and draining.  Area was completely fine until this latest episode, started few days ago.     Current Medications: has a current medication list which includes the following prescription(s): acetaminophen and ibuprofen.  Allergies:  No Known Allergies  ROS: General: Denies weight loss, weight gain, fatigue, fevers, chills, and night sweats. Heart: Denies chest pain, palpitations, racing heart, irregular heartbeat, leg pain or swelling, and decreased activity tolerance. Respiratory: Denies breathing difficulty, shortness of breath, wheezing, cough, and sputum. GI: Denies change in appetite, heartburn, nausea, vomiting, constipation, diarrhea, and blood in stool. GU: Denies difficulty urinating, pain with urinating, urgency, frequency, blood in urine    Objective:     BP 128/83   Pulse 79   Ht 152.4 cm (5')   Wt 64.9 kg (143 lb)   BMI 27.93 kg/m   Constitutional :  alert, appears stated age, cooperative and no distress  Musculoskeletal: Steady gait and movement  Skin: Cool and moist    Psychiatric: Normal affect, non-agitated, not confused  Breast:   Right breast periaeroloar incision site open again 1cm x 1cm opening with red granluation tissue and moderate purulent drainage.  Surrounding firm tissue, TTP, with erythema.    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  Plan:     1. Actively draining and recurring in same spot again.  Will need a formal debridement and tissue sampling of the abscess cavity to see if there is a reason why this keeps recurring in same spot in a otherwise healthy individual  with no specific risk factors for recurrent soft tissue infections.  Discussed surgical excision.  Alternatives include continued observation with abx.  Benefits include possible symptom relief, pathologic evaluation. Discussed the risk of surgery including recurrence, chronic pain,poor cosmesis, poor/delayed wound healing, and possible re-operation to address said risks. The risks of general anesthetic, if used, includes MI, CVA, sudden death or even reaction to anesthetic medications also discussed.   Pt verbalized understanding and all questions addressed.

## 2019-10-04 ENCOUNTER — Ambulatory Visit: Payer: Self-pay | Admitting: Anesthesiology

## 2019-10-04 ENCOUNTER — Ambulatory Visit
Admission: RE | Admit: 2019-10-04 | Discharge: 2019-10-04 | Disposition: A | Discharge status: Home / Self Care | Payer: Self-pay | Attending: Surgery | Admitting: Surgery

## 2019-10-04 ENCOUNTER — Encounter
Admission: RE | Disposition: A | Discharge status: Home / Self Care | Payer: Self-pay | Source: Home / Self Care | Attending: Surgery

## 2019-10-04 ENCOUNTER — Other Ambulatory Visit: Payer: Self-pay

## 2019-10-04 ENCOUNTER — Encounter: Payer: Self-pay | Admitting: Surgery

## 2019-10-04 DIAGNOSIS — B957 Other staphylococcus as the cause of diseases classified elsewhere: Secondary | ICD-10-CM | POA: Insufficient documentation

## 2019-10-04 DIAGNOSIS — N611 Abscess of the breast and nipple: Secondary | ICD-10-CM | POA: Insufficient documentation

## 2019-10-04 HISTORY — PX: INCISION AND DRAINAGE ABSCESS: SHX5864

## 2019-10-04 LAB — POCT PREGNANCY, URINE: Preg Test, Ur: NEGATIVE

## 2019-10-04 LAB — SARS CORONAVIRUS 2 (TAT 6-24 HRS): SARS Coronavirus 2: NEGATIVE

## 2019-10-04 SURGERY — INCISION AND DRAINAGE, ABSCESS
Anesthesia: General | Site: Breast | Laterality: Right

## 2019-10-04 MED ORDER — CHLORHEXIDINE GLUCONATE 0.12 % MT SOLN: 15.0000 mL | Freq: Once | OROMUCOSAL | Status: AC

## 2019-10-04 MED ORDER — DEXAMETHASONE SODIUM PHOSPHATE 10 MG/ML IJ SOLN
INTRAMUSCULAR | Status: DC | PRN
  Administered 2019-10-04: 5 mg via INTRAVENOUS

## 2019-10-04 MED ORDER — PROPOFOL 10 MG/ML IV BOLUS
INTRAVENOUS | Status: DC | PRN
  Administered 2019-10-04: 150 mg via INTRAVENOUS

## 2019-10-04 MED ORDER — IBUPROFEN 800 MG PO TABS: 800.0000 mg | ORAL_TABLET | Freq: Three times a day (TID) | ORAL | 0 refills | Status: DC | PRN

## 2019-10-04 MED ORDER — ACETAMINOPHEN 325 MG PO TABS: 650.0000 mg | ORAL_TABLET | Freq: Three times a day (TID) | ORAL | 0 refills | Status: AC | PRN

## 2019-10-04 MED ORDER — FENTANYL CITRATE (PF) 100 MCG/2ML IJ SOLN
INTRAMUSCULAR | Status: DC | PRN
Start: 2019-10-04 — End: 2019-10-04
  Administered 2019-10-04 (×2): 50 ug via INTRAVENOUS

## 2019-10-04 MED ORDER — DOCUSATE SODIUM 100 MG PO CAPS: 100.0000 mg | ORAL_CAPSULE | Freq: Two times a day (BID) | ORAL | 0 refills | Status: AC | PRN

## 2019-10-04 MED ORDER — LIDOCAINE HCL (PF) 2 % IJ SOLN
INTRAMUSCULAR | Status: AC
  Filled 2019-10-04: qty 5

## 2019-10-04 MED ORDER — FENTANYL CITRATE (PF) 100 MCG/2ML IJ SOLN
INTRAMUSCULAR | Status: AC
  Filled 2019-10-04: qty 2

## 2019-10-04 MED ORDER — ONDANSETRON HCL 4 MG/2ML IJ SOLN
INTRAMUSCULAR | Status: AC
  Filled 2019-10-04: qty 2

## 2019-10-04 MED ORDER — FENTANYL CITRATE (PF) 100 MCG/2ML IJ SOLN: 25.0000 ug | INTRAMUSCULAR | Status: DC | PRN

## 2019-10-04 MED ORDER — LIDOCAINE HCL (PF) 1 % IJ SOLN
INTRAMUSCULAR | Status: AC
  Filled 2019-10-04: qty 30

## 2019-10-04 MED ORDER — LIDOCAINE HCL (CARDIAC) PF 100 MG/5ML IV SOSY
PREFILLED_SYRINGE | INTRAVENOUS | Status: DC | PRN
  Administered 2019-10-04: 50 mg via INTRAVENOUS

## 2019-10-04 MED ORDER — BUPIVACAINE-EPINEPHRINE (PF) 0.25% -1:200000 IJ SOLN
INTRAMUSCULAR | Status: AC
  Filled 2019-10-04: qty 30

## 2019-10-04 MED ORDER — ONDANSETRON HCL 4 MG/2ML IJ SOLN: 4.0000 mg | Freq: Once | INTRAMUSCULAR | Status: DC | PRN

## 2019-10-04 MED ORDER — LACTATED RINGERS IV SOLN: INTRAVENOUS | Status: DC

## 2019-10-04 MED ORDER — ORAL CARE MOUTH RINSE: 15.0000 mL | Freq: Once | OROMUCOSAL | Status: AC

## 2019-10-04 MED ORDER — CHLORHEXIDINE GLUCONATE CLOTH 2 % EX PADS: 6.0000 | MEDICATED_PAD | Freq: Once | CUTANEOUS | Status: DC

## 2019-10-04 MED ORDER — CEFAZOLIN SODIUM-DEXTROSE 2-4 GM/100ML-% IV SOLN
2.0000 g | INTRAVENOUS | Status: AC
  Administered 2019-10-04: 2 g via INTRAVENOUS

## 2019-10-04 MED ORDER — CEFAZOLIN SODIUM-DEXTROSE 2-4 GM/100ML-% IV SOLN
INTRAVENOUS | Status: AC
  Filled 2019-10-04: qty 100

## 2019-10-04 MED ORDER — LIDOCAINE HCL 1 % IJ SOLN
INTRAMUSCULAR | Status: DC | PRN
  Administered 2019-10-04: 10 mL

## 2019-10-04 MED ORDER — MIDAZOLAM HCL 2 MG/2ML IJ SOLN
INTRAMUSCULAR | Status: AC
  Filled 2019-10-04: qty 2

## 2019-10-04 MED ORDER — DEXAMETHASONE SODIUM PHOSPHATE 10 MG/ML IJ SOLN
INTRAMUSCULAR | Status: AC
  Filled 2019-10-04: qty 1

## 2019-10-04 MED ORDER — ONDANSETRON HCL 4 MG/2ML IJ SOLN
INTRAMUSCULAR | Status: DC | PRN
  Administered 2019-10-04: 4 mg via INTRAVENOUS

## 2019-10-04 MED ORDER — MIDAZOLAM HCL 2 MG/2ML IJ SOLN
INTRAMUSCULAR | Status: DC | PRN
  Administered 2019-10-04: 2 mg via INTRAVENOUS

## 2019-10-04 MED ORDER — HYDROCODONE-ACETAMINOPHEN 5-325 MG PO TABS
1.0000 | ORAL_TABLET | Freq: Four times a day (QID) | ORAL | 0 refills | Status: DC | PRN
Start: 2019-10-04 — End: 2020-09-25

## 2019-10-04 MED ORDER — CHLORHEXIDINE GLUCONATE 0.12 % MT SOLN
OROMUCOSAL | Status: AC
  Administered 2019-10-04: 15 mL via OROMUCOSAL
  Filled 2019-10-04: qty 15

## 2019-10-04 MED ORDER — PROPOFOL 10 MG/ML IV BOLUS
INTRAVENOUS | Status: AC
  Filled 2019-10-04: qty 20

## 2019-10-04 SURGICAL SUPPLY — 30 items
APL PRP STRL LF DISP 70% ISPRP (MISCELLANEOUS)
BLADE SURG 15 STRL LF DISP TIS (BLADE) ×1 IMPLANT
BLADE SURG 15 STRL SS (BLADE) ×2
CANISTER SUCT 1200ML W/VALVE (MISCELLANEOUS) IMPLANT
CHLORAPREP W/TINT 26 (MISCELLANEOUS) IMPLANT
COVER WAND RF STERILE (DRAPES) ×2 IMPLANT
DRAPE LAPAROTOMY 100X77 ABD (DRAPES) ×2 IMPLANT
ELECT REM PT RETURN 9FT ADLT (ELECTROSURGICAL) ×2
ELECTRODE REM PT RTRN 9FT ADLT (ELECTROSURGICAL) ×1 IMPLANT
GAUZE PACKING IODOFORM 1/2 (PACKING) ×2 IMPLANT
GAUZE SPONGE 4X4 12PLY STRL (GAUZE/BANDAGES/DRESSINGS) ×2 IMPLANT
GLOVE BIOGEL PI IND STRL 7.0 (GLOVE) ×1 IMPLANT
GLOVE BIOGEL PI INDICATOR 7.0 (GLOVE) ×1
GLOVE SURG SYN 6.5 ES PF (GLOVE) ×4 IMPLANT
GOWN STRL REUS W/ TWL LRG LVL3 (GOWN DISPOSABLE) ×2 IMPLANT
GOWN STRL REUS W/TWL LRG LVL3 (GOWN DISPOSABLE) ×4
LABEL OR SOLS (LABEL) ×2 IMPLANT
MANIFOLD NEPTUNE II (INSTRUMENTS) ×2 IMPLANT
NEEDLE HYPO 22GX1.5 SAFETY (NEEDLE) ×2 IMPLANT
NS IRRIG 500ML POUR BTL (IV SOLUTION) ×2 IMPLANT
PACK BASIN MINOR (MISCELLANEOUS) ×2 IMPLANT
SUT MNCRL 4-0 (SUTURE)
SUT MNCRL 4-0 27XMFL (SUTURE)
SUT VIC AB 2-0 CT2 27 (SUTURE) IMPLANT
SUT VIC AB 3-0 SH 27 (SUTURE)
SUT VIC AB 3-0 SH 27X BRD (SUTURE) IMPLANT
SUTURE MNCRL 4-0 27XMF (SUTURE) IMPLANT
SYR 10ML LL (SYRINGE) ×2 IMPLANT
TOWEL OR 17X26 4PK STRL BLUE (TOWEL DISPOSABLE) ×2 IMPLANT
TRAY PREP VAG/GEN (MISCELLANEOUS) ×2 IMPLANT

## 2019-10-04 NOTE — Discharge Instructions (Signed)
AMBULATORY SURGERY  DISCHARGE INSTRUCTIONS   1) The drugs that you were given will stay in your system until tomorrow so for the next 24 hours you should not:  A) Drive an automobile B) Make any legal decisions C) Drink any alcoholic beverage   2) You may resume regular meals tomorrow.  Today it is better to start with liquids and gradually work up to solid foods.  You may eat anything you prefer, but it is better to start with liquids, then soup and crackers, and gradually work up to solid foods.   3) Please notify your doctor immediately if you have any unusual bleeding, trouble breathing, redness and pain at the surgery site, drainage, fever, or pain not relieved by medication. 4)   5) Your post-operative visit with Dr.                                     is: Date:                        Time:    Please call to schedule your post-operative visit.  6) Additional Instructions:  I&D, Care After This sheet gives you information about how to care for yourself after your procedure. Your health care provider may also give you more specific instructions. If you have problems or questions, contact your health care provider. What can I expect after the procedure? After the procedure, it is common to have:  Soreness.  Bruising.  Itching. Follow these instructions at home: site care Follow instructions from your health care provider about how to take care of your site. Make sure you:  Wash your hands with soap and water before and after you change your bandage (dressing). If soap and water are not available, use hand sanitizer.  Leave stitches (sutures), skin glue, or adhesive strips in place. These skin closures may need to stay in place for 2 weeks or longer. If adhesive strip edges start to loosen and curl up, you may trim the loose edges. Do not remove adhesive strips completely unless your health care provider tells you to do that.  If the area bleeds or bruises, apply gentle  pressure for 10 minutes.  OK TO SHOWER IN 24HRS  Check your site every day for signs of infection. Check for:  Redness, swelling, or pain.  Fluid or blood.  Warmth.  Pus or a bad smell.  General instructions  Rest and then return to your normal activities as told by your health care provider. .  tylenol and advil as needed for discomfort.  Please alternate between the two every four hours as needed for pain.   .  Use narcotics, if prescribed, only when tylenol and motrin is not enough to control pain. .  325-650mg  every 8hrs to max of 3000mg /24hrs (including the 325mg  in every norco dose) for the tylenol.   .  Advil up to 800mg  per dose every 8hrs as needed for pain.    Keep all follow-up visits as told by your health care provider. This is important. Contact a health care provider if:  You have redness, swelling, or pain around your site.  You have fluid or blood coming from your site.  Your site feels warm to the touch.  You have pus or a bad smell coming from your site.  You have a fever.  Your sutures, skin glue, or adhesive  strips loosen or come off sooner than expected. Get help right away if:  You have bleeding that does not stop with pressure or a dressing. Summary  After the procedure, it is common to have some soreness, bruising, and itching at the site.  Follow instructions from your health care provider about how to take care of your site.  Check your site every day for signs of infection.  Contact a health care provider if you have redness, swelling, or pain around your site, or your site feels warm to the touch.  Keep all follow-up visits as told by your health care provider. This is important. This information is not intended to replace advice given to you by your health care provider. Make sure you discuss any questions you have with your health care provider. Document Released: 01/25/2015 Document Revised: 06/28/2017 Document Reviewed:  06/28/2017 Elsevier Interactive Patient Education  [employer].

## 2019-10-04 NOTE — Transfer of Care (Signed)
Immediate Anesthesia Transfer of Care Note  Patient: Linder Prajapati  Procedure(s) Performed: INCISION AND DRAINAGE BREAST ABSCESS (Right )  Patient Location: PACU  Anesthesia Type:General  Level of Consciousness: drowsy  Airway & Oxygen Therapy: Patient Spontanous Breathing and Patient connected to nasal cannula oxygen  Post-op Assessment: Report given to RN  Post vital signs: stable  Last Vitals:  Vitals Value Taken Time  BP    Temp    Pulse    Resp    SpO2      Last Pain:  Vitals:   10/04/19 1331  TempSrc: Oral  PainSc: 0-No pain         Complications: No complications documented.

## 2019-10-04 NOTE — Anesthesia Preprocedure Evaluation (Signed)
Anesthesia Evaluation  Patient identified by MRN, date of birth, ID band Patient awake    Reviewed: Allergy & Precautions, H&P , NPO status , Patient's Chart, lab work & pertinent test results, reviewed documented beta blocker date and time   Airway Mallampati: II  TM Distance: >3 FB Neck ROM: full    Dental  (+) Teeth Intact   Pulmonary asthma ,    Pulmonary exam normal        Cardiovascular Exercise Tolerance: Good negative cardio ROS Normal cardiovascular exam Rate:Normal     Neuro/Psych negative neurological ROS  negative psych ROS   GI/Hepatic negative GI ROS, Neg liver ROS,   Endo/Other  negative endocrine ROS  Renal/GU negative Renal ROS  negative genitourinary   Musculoskeletal   Abdominal   Peds  Hematology negative hematology ROS (+)   Anesthesia Other Findings   Reproductive/Obstetrics negative OB ROS                             Anesthesia Physical Anesthesia Plan  ASA: II  Anesthesia Plan: General LMA   Post-op Pain Management:    Induction:   PONV Risk Score and Plan:   Airway Management Planned:   Additional Equipment:   Intra-op Plan:   Post-operative Plan:   Informed Consent: I have reviewed the patients History and Physical, chart, labs and discussed the procedure including the risks, benefits and alternatives for the proposed anesthesia with the patient or authorized representative who has indicated his/her understanding and acceptance.       Plan Discussed with: CRNA  Anesthesia Plan Comments:         Anesthesia Quick Evaluation

## 2019-10-04 NOTE — Anesthesia Procedure Notes (Signed)
Procedure Name: LMA Insertion Date/Time: 10/04/2019 2:27 PM Performed by: Jaye Beagle, CRNA Pre-anesthesia Checklist: Patient identified, Emergency Drugs available, Suction available and Patient being monitored Patient Re-evaluated:Patient Re-evaluated prior to induction Oxygen Delivery Method: Circle system utilized Preoxygenation: Pre-oxygenation with 100% oxygen Induction Type: IV induction Ventilation: Mask ventilation without difficulty LMA: LMA inserted LMA Size: 3.5 Number of attempts: 1 Tube secured with: Tape Dental Injury: Teeth and Oropharynx as per pre-operative assessment

## 2019-10-04 NOTE — Interval H&P Note (Signed)
History and Physical Interval Note:  10/04/2019 1:41 PM  Kristina Kim  has presented today for surgery, with the diagnosis of Abscess.  The various methods of treatment have been discussed with the patient and family. After consideration of risks, benefits and other options for treatment, the patient has consented to  Procedure(s): INCISION AND DRAINAGE BREAST ABSCESS (Right) as a surgical intervention.  The patient's history has been reviewed, patient examined, no change in status, stable for surgery.  I have reviewed the patient's chart and labs.  Questions were answered to the patient's satisfaction.     Amira Podolak Tonna Boehringer

## 2019-10-04 NOTE — Op Note (Signed)
Preoperative diagnosis: recurrent right breast abscess Postoperative diagnosis: same  Procedure: Incision and drainage of right breast abscess and abscess cavity tissue biopsy  Anesthesia: LMA  Surgeon: Tonna Boehringer  Wound Classification: Contaminated  Indications: Patient is a 29 y.o. female  presented with above.  See H&P for further details.  Specimen: right breast abscess tissue  Complications: None  Estimated Blood Loss: 66mL  Findings:  1. Recurrent abscess pocket  2. purulent secretions drained tissue obtained for culture 3. Adequate hemostasis.   Description of procedure: The patient was placed in the supine position and LMA anesthesia was induced. The area was prepped and draped in the usual sterile fashion. A timeout was completed verifying correct patient, procedure, site, positioning, and implant(s) and/or special equipment prior to beginning this procedure.  Local infused over planned incision site.  Inicision made extending already open wound at [facility] position and purulent secretions was drained. Dissection carried down to abscess cavity measuring roughly 5 cm x 6cm x 4cm deep, located in upper inner quadrant of breast.  Inspection of cavity noted healthy breat tissue, with no evidence of persistent necrotic/infected tissue.  Sample of fibrinous and fatty tissue taken from within abscess cavity and sent for culture due to recurrent nature of abscess.  No additional tunneling noted.  No other pathology such as occult mass palpated or visualized.  The wound then irrigated, hemostasis confirmed and then packed with an iodine packing, dressed with 4x4 gauze and secured with paper tape.  The patient tolerated the procedure well and was taken to the postanesthesia care unit in satisfactory condition. Sponge and instrument count correct at end of procedure

## 2019-10-05 ENCOUNTER — Encounter: Payer: Self-pay | Admitting: Surgery

## 2019-10-09 LAB — AEROBIC/ANAEROBIC CULTURE W GRAM STAIN (SURGICAL/DEEP WOUND)

## 2019-10-10 NOTE — Anesthesia Postprocedure Evaluation (Signed)
Anesthesia Post Note  Patient: Kristina Kim  Procedure(s) Performed: INCISION AND DRAINAGE BREAST ABSCESS (Right Breast)  Patient location during evaluation: PACU Anesthesia Type: General Level of consciousness: awake and alert Pain management: pain level controlled Vital Signs Assessment: post-procedure vital signs reviewed and stable Respiratory status: spontaneous breathing, nonlabored ventilation, respiratory function stable and patient connected to nasal cannula oxygen Cardiovascular status: blood pressure returned to baseline and stable Postop Assessment: no apparent nausea or vomiting Anesthetic complications: no   No complications documented.   Last Vitals:  Vitals:   10/04/19 1543 10/04/19 1553  BP: 114/80 127/84  Pulse: 65 69  Resp: 15 11  Temp: (!) 36.2 C   SpO2: 98% 96%    Last Pain:  Vitals:   10/04/19 1553  TempSrc:   PainSc: 2                  Yevette Edwards

## 2020-01-23 ENCOUNTER — Other Ambulatory Visit: Payer: Self-pay

## 2020-01-27 ENCOUNTER — Other Ambulatory Visit: Payer: Self-pay

## 2020-01-27 DIAGNOSIS — Z20822 Contact with and (suspected) exposure to covid-19: Secondary | ICD-10-CM

## 2020-01-30 LAB — NOVEL CORONAVIRUS, NAA: SARS-CoV-2, NAA: NOT DETECTED

## 2020-04-29 ENCOUNTER — Encounter: Payer: Self-pay | Admitting: Internal Medicine

## 2020-04-30 ENCOUNTER — Other Ambulatory Visit: Payer: Self-pay | Admitting: Surgery

## 2020-04-30 DIAGNOSIS — N611 Abscess of the breast and nipple: Secondary | ICD-10-CM

## 2020-05-03 ENCOUNTER — Other Ambulatory Visit: Payer: Self-pay

## 2020-06-21 IMAGING — MG US ASPIRATION RIGHT BREAST
1 series · 2 of 2 positions shown · non-contrast
Comparison: Previous exams.
COMPARISON: Previous exams.

Addendum:
CLINICAL DATA: 28-year-old female for aspiration of RETROAREOLAR
RIGHT breast abscess.

EXAM:
ULTRASOUND GUIDED RIGHT BREAST ASPIRATION

[Series 1: MG view · 0.06mm/px · 2 of 2 slices shown]
[im 1/2]
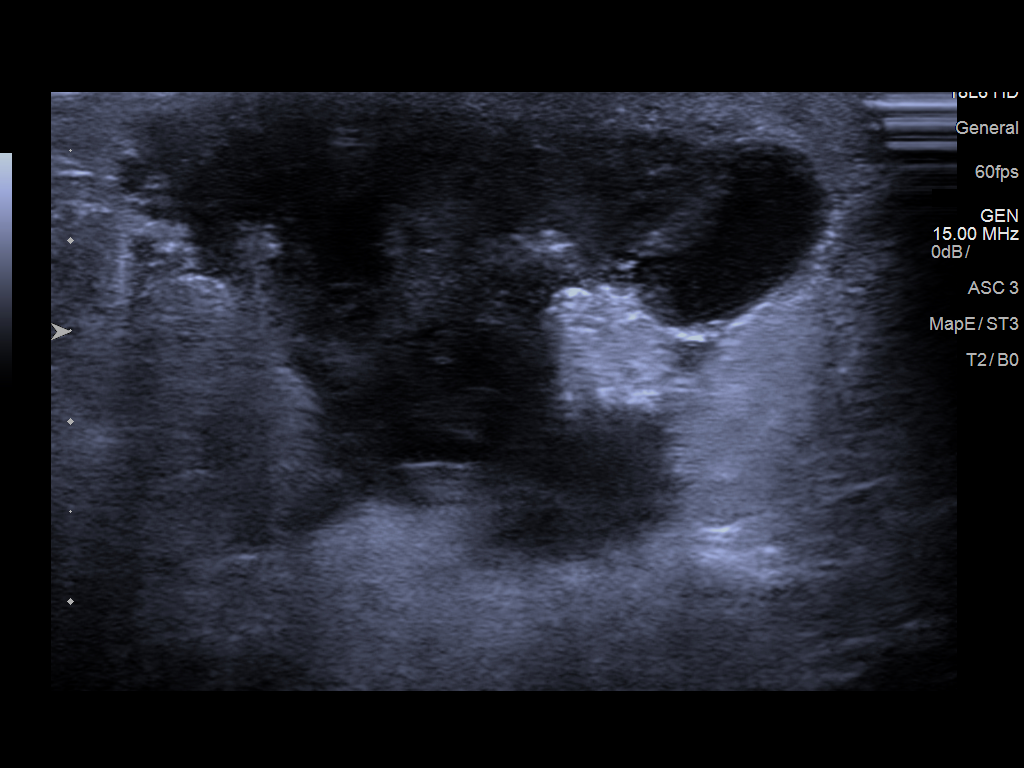
[im 2/2]
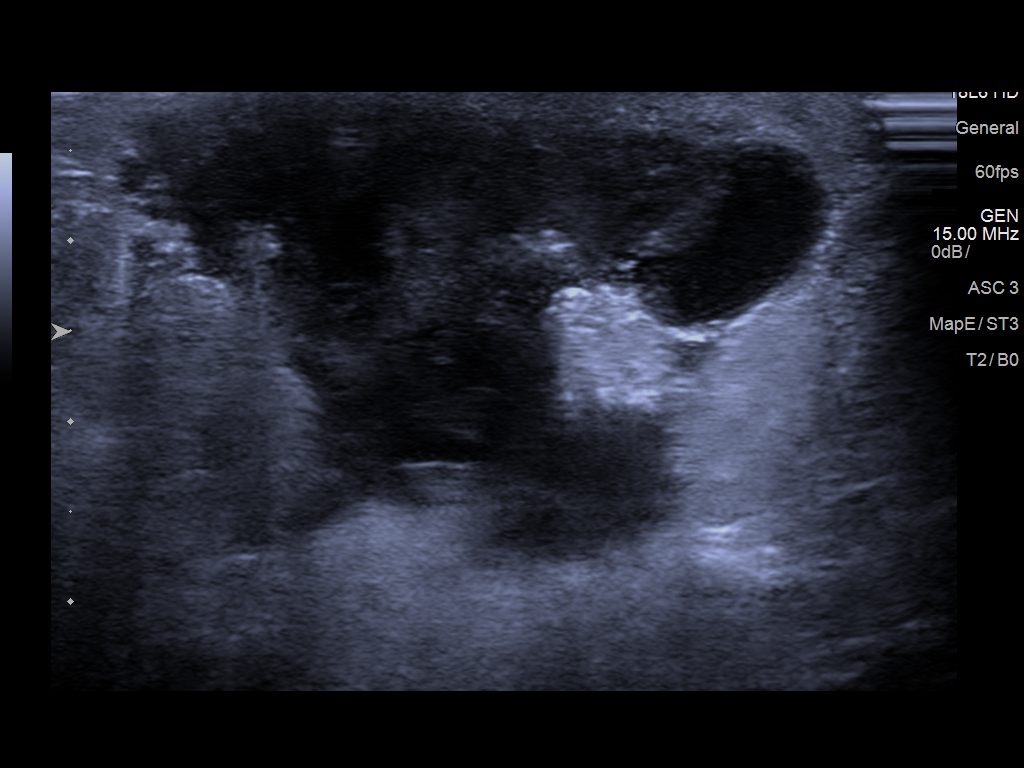

[2 of 2 positions shown; findings below may reference images not displayed]

PROCEDURE:
Using sterile technique, 1% lidocaine, under direct ultrasound
visualization, a 9 gauge needle was used for aspiration of the
RETROAREOLAR RIGHT breast abscess. Only 10 cc of pus was able to be
aspirated given very thick nature of the pus and multiple
septations. Specimen was sent to microbiology for further
evaluation.

Residual abscess measures 2.2 x 2 cm, previously measuring 3.2 x
cm pre aspiration.
IMPRESSION: Ultrasound-guided aspiration of RIGHT breast abscess with only 10 cc
of pus aspirated due to very thick nature and septations. Residual
abscess measures 2.2 x 2 cm, previously 3.2 x 2.3 cm. No apparent
complications.

RECOMMENDATIONS:
RIGHT breast ultrasound follow-up in 6 days.

ADDENDUM:
PATHOLOGY culture of RIGHT breast abscess aspiration revealed:

ABSCESS RIGHT BREAST :

Gram Stain ABUNDANT WBC PRESENT, PREDOMINANTLY PMN

ABUNDANT GRAM NEGATIVE RODS

MODERATE GRAM POSITIVE COCCI

RARE GRAM VARIABLE ROD

Culture ABUNDANT STAPHYLOCOCCUS LUGDUNENSIS

ABUNDANT PREVOTELLA BIVIA

BETA LACTAMASE POSITIVE

Report Status 01/20/2019 FINAL

Organism ID, Bacteria STAPHYLOCOCCUS LUGDUNENSIS

Pathology results are CONCORDANT with imaging findings, per Dr. Ezmalai
Zeinab.

Multiple attempts to contact patient were unsuccessful. I spoke with
'[REDACTED]' at Dr. [REDACTED] on 01/18/2019, and requested she
relay pathology results to provider to ensure patient is on
appropriate antibiotics. I sent in-basket message to provider with
preliminary laboratory results on 01/18/2019. Any changes to
antibiotics will be managed by referring provider.

Recommendation: RIGHT breast ultrasound follow-up in 6 days
(Patient's follow up ultrasound scheduled for 01/23/2019 at [HOSPITAL]
[REDACTED]).

Addendum by Bempah Figure RN on 01/23/2019.

*** End of Addendum ***
PROCEDURE:
Using sterile technique, 1% lidocaine, under direct ultrasound
visualization, a 9 gauge needle was used for aspiration of the
RETROAREOLAR RIGHT breast abscess. Only 10 cc of pus was able to be
aspirated given very thick nature of the pus and multiple
septations. Specimen was sent to microbiology for further
evaluation.

Residual abscess measures 2.2 x 2 cm, previously measuring 3.2 x
cm pre aspiration.
IMPRESSION: Ultrasound-guided aspiration of RIGHT breast abscess with only 10 cc
of pus aspirated due to very thick nature and septations. Residual
abscess measures 2.2 x 2 cm, previously 3.2 x 2.3 cm. No apparent
complications.

RECOMMENDATIONS:
RIGHT breast ultrasound follow-up in 6 days.

## 2020-07-26 IMAGING — US US BREAST*R* LIMITED INC AXILLA
1 series · 5 of 5 positions shown · non-contrast
Comparison: Previous exam(s).

CLINICAL DATA: Short-term follow-up for left breast abscess. The
patient states she has had 2 courses of antibiotics, with the most
recent course of antibiotics completed nearly 2 weeks ago. She
states the wound is still draining. Denies any fevers. Attempt at
cyst aspiration 01/17/2019 yielded only a small amount of purulent
fluid due to thick nature and multiple septations.

EXAM:
ULTRASOUND OF THE LEFT BREAST

[Series 1: us breast*right* limited inc axilla · 0.07mm/px · 5 of 5 slices shown]
[im 1/5]
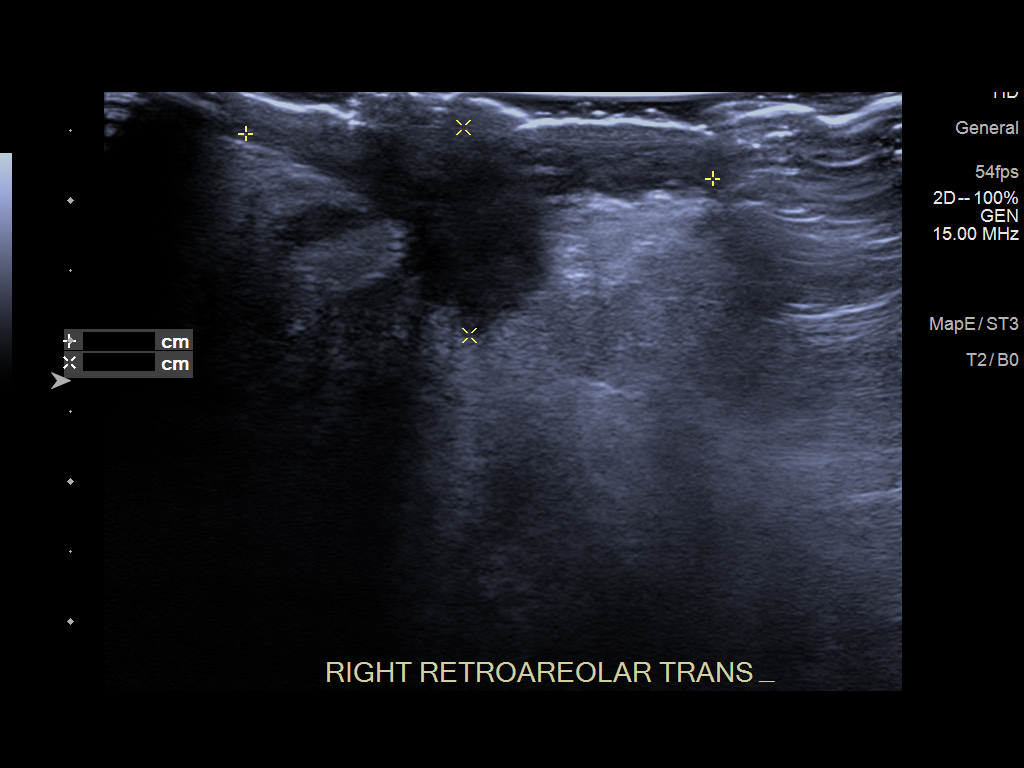
[im 2/5]
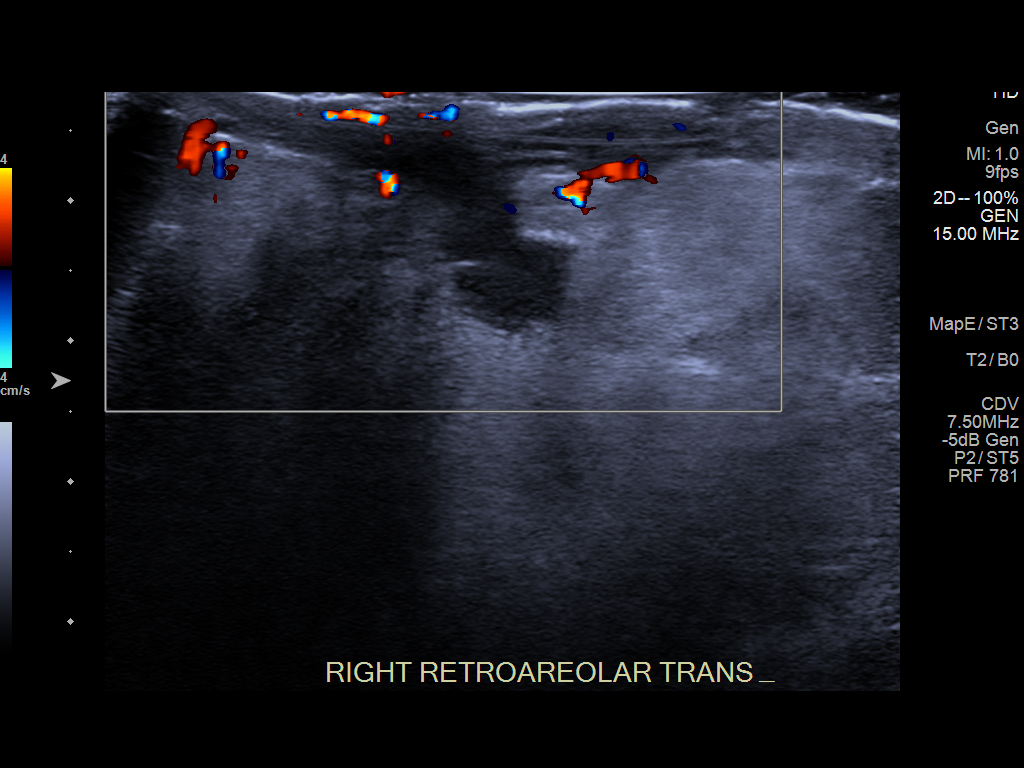
[im 3/5]
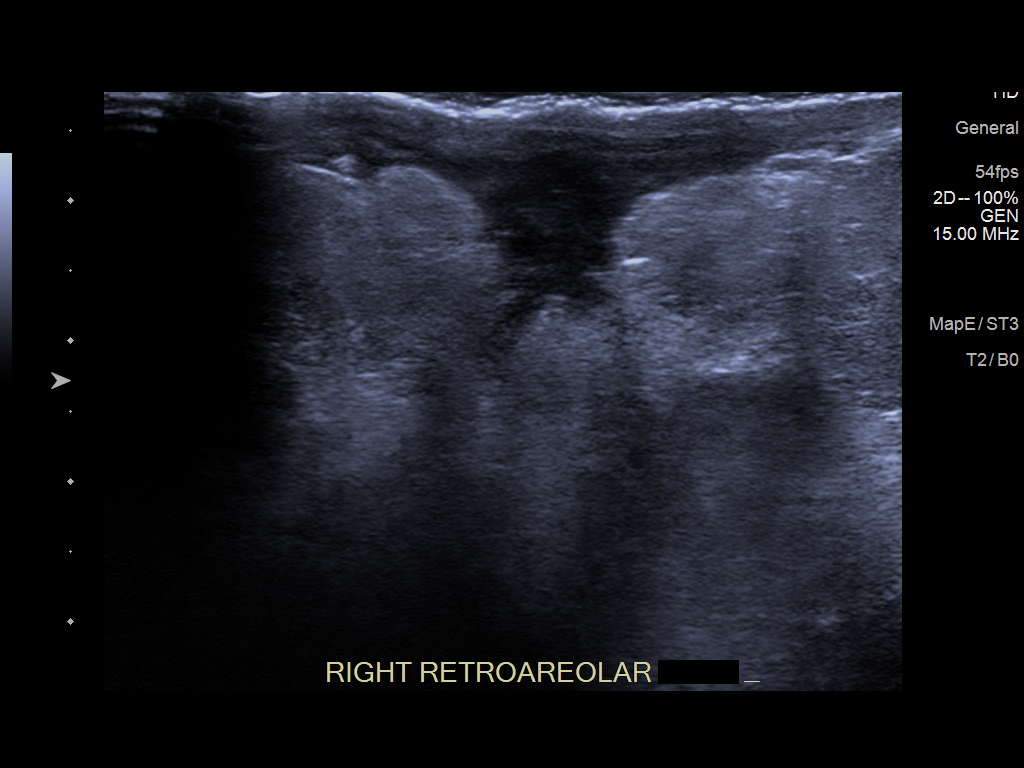
[im 4/5]
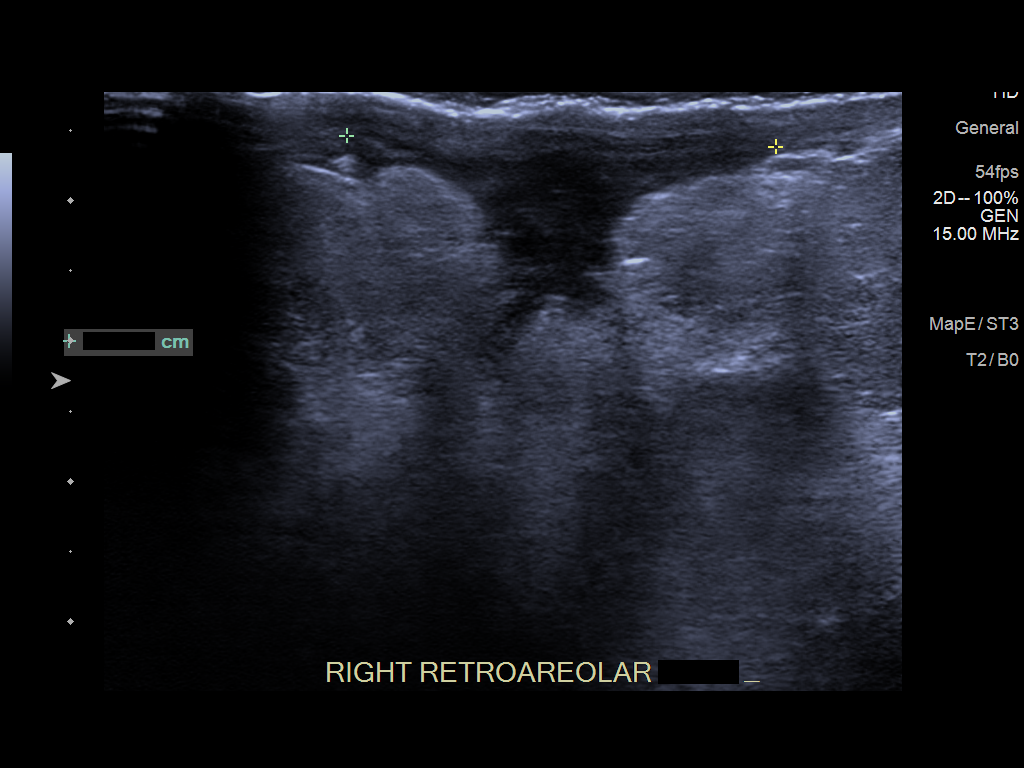
[im 5/5]
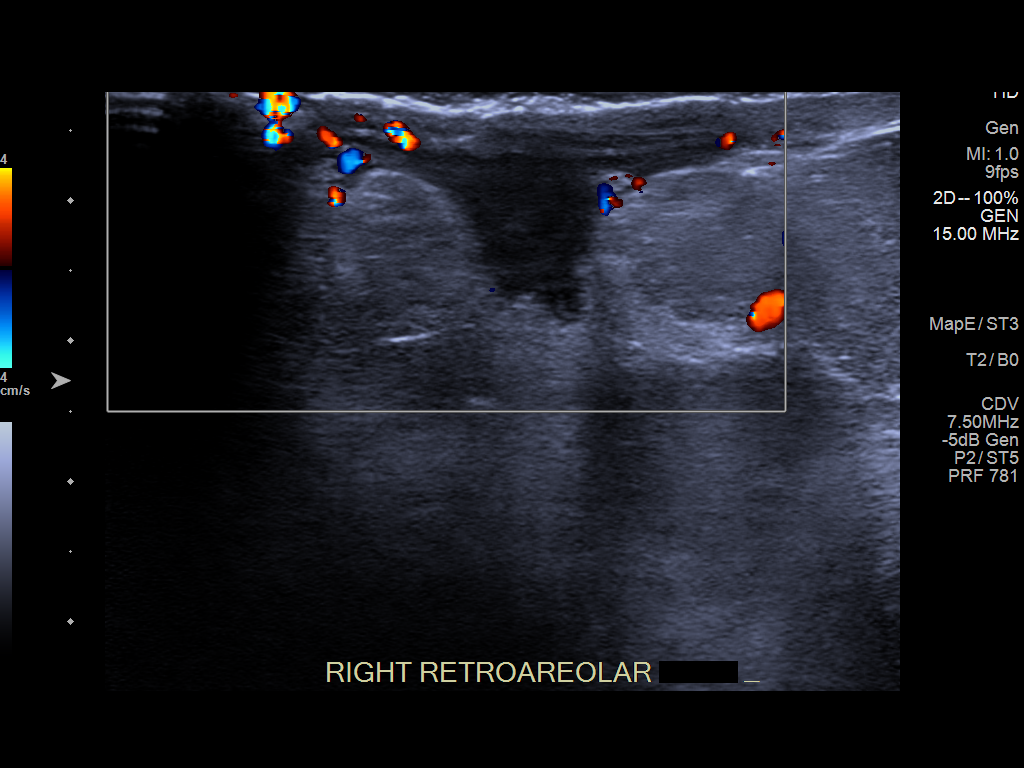

[5 of 5 positions shown; findings below may reference images not displayed]

FINDINGS: Physical examination reveals mild erythema involving the periareolar
right breast with an open wound present slightly medially along the
areola.

Targeted ultrasound of the right breast was performed. There is a
heterogeneous collection in the retroareolar right breast measuring
up to 3.1 x 1.5 x 3.3 cm, slightly decreased in size when compared
to the prior exam, previously measuring 3.5 x 1.8 x 3 cm when
remeasured in a similar fashion.
IMPRESSION: Persistent right breast abscess, slightly decreased in size when
compared to prior ultrasound dated 01/23/2019.

RECOMMENDATION:
1. Given persistent abscess in the retroareolar right breast with
continued drainage, surgical consultation is recommended for
possible I&D.

2. Recommend the patient return in approximately 4-6 weeks for an
additional right breast ultrasound to ensure resolution/continued
improvement of the findings in the retroareolar right breast.

I have discussed the findings and recommendations with the patient.
If applicable, a reminder letter will be sent to the patient
regarding the next appointment.

BI-RADS CATEGORY  3: Probably benign.

## 2020-08-16 ENCOUNTER — Ambulatory Visit: Payer: Self-pay | Admitting: Advanced Practice Midwife

## 2020-09-25 ENCOUNTER — Ambulatory Visit (INDEPENDENT_AMBULATORY_CARE_PROVIDER_SITE_OTHER): Payer: Self-pay | Admitting: Internal Medicine

## 2020-09-25 ENCOUNTER — Encounter: Payer: Self-pay | Admitting: Internal Medicine

## 2020-09-25 ENCOUNTER — Other Ambulatory Visit: Payer: Self-pay

## 2020-09-25 VITALS — BP 118/70 | HR 60 | Ht 60.0 in | Wt 142.0 lb

## 2020-09-25 DIAGNOSIS — N611 Abscess of the breast and nipple: Secondary | ICD-10-CM

## 2020-09-25 MED ORDER — AMOXICILLIN-POT CLAVULANATE 875-125 MG PO TABS: 1.0000 | ORAL_TABLET | Freq: Two times a day (BID) | ORAL | 0 refills | Status: AC

## 2020-09-25 NOTE — Progress Notes (Signed)
Date:  09/25/2020   Name:  Kristina Kim   DOB:  1990-05-12   MRN:  [id number]   Chief Complaint: Breast Pain  Breast Pain: Right breast pain. Pain started 1 year ago. Patient describes pain as red, and tender. Pain is located on the areola. Sometimes white discharge comes from nipple. Feels breast lump under areola. Patient has had 1 incision and drainage in the past, and 2 breast surgeries on the Right Breast.    Clinical Intake:  Pre-visit preparation completed: Yes  Pain : 0-10 Pain Type: Acute pain Pain Location: Breast Pain Descriptors / Indicators: Dull, Tender Pain Frequency: Intermittent     Diabetes: No  Activities of Daily Living: Independent Ambulation: Independent Medication Administration: Independent Home Management: Independent     Do you feel unsafe in your current relationship?: No Do you feel physically threatened by others?: No Anyone hurting you at home, work, or school?: No  How often do you need to have someone help you when you read instructions, pamphlets, or other written materials from your doctor or pharmacy?: 1 - Never  Interpreter Needed?: No  Information entered by :: Lovett Calender, CMA    Lab Results  Component Value Date   CREATININE 0.57 01/17/2019   BUN 8 01/17/2019   NA 138 01/17/2019   K 3.7 01/17/2019   CL 109 01/17/2019   CO2 22 01/17/2019   No results found for: CHOL, HDL, LDLCALC, LDLDIRECT, TRIG, CHOLHDL No results found for: TSH No results found for: HGBA1C Lab Results  Component Value Date   WBC 10.1 01/17/2019   HGB 11.8 (L) 01/17/2019   HCT 36.7 01/17/2019   MCV 84.8 01/17/2019   PLT 298 01/17/2019   Lab Results  Component Value Date   ALT 18 01/16/2019   AST 24 01/16/2019   ALKPHOS 81 01/16/2019   BILITOT 0.6 01/16/2019     Review of Systems  Constitutional:  Negative for chills, fatigue and fever.  Respiratory:  Negative for chest tightness and shortness of breath.   Cardiovascular:  Negative for  chest pain.  Genitourinary:        Right breast tenderness and redness; no drainage   Patient Active Problem List   Diagnosis Date Noted   Breast abscess 01/16/2019   Abnormal cervical Papanicolaou smear 11/18/2014    No Known Allergies  Past Surgical History:  Procedure Laterality Date   COLPOSCOPY  2015   abnormal Pap [facility]   INCISION AND DRAINAGE ABSCESS Right 03/10/2019   Procedure: INCISION AND DRAINAGE ABSCESS;  Surgeon: Sung Amabile, DO;  Location: [facility] ORS;  Service: General;  Laterality: Right;   INCISION AND DRAINAGE ABSCESS Right 10/04/2019   Procedure: INCISION AND DRAINAGE BREAST ABSCESS;  Surgeon: Sung Amabile, DO;  Location: [facility] ORS;  Service: General;  Laterality: Right;    Social History   Tobacco Use   Smoking status: Never   Smokeless tobacco: Never  Vaping Use   Vaping Use: Never used  Substance Use Topics   Alcohol use: Yes    Alcohol/week: 0.0 standard drinks    Comment: occassionally   Drug use: Yes    Types: Marijuana     Medication list has been reviewed and updated.  No outpatient medications have been marked as taking for the 09/25/20 encounter (Office Visit) with Reubin Milan, MD.    [facility] 2/9 Scores 09/25/2020 04/26/2017 08/21/2015 03/15/2015  PHQ - 2 Score 0 0 2 0  PHQ- 9 Score 2 0 5 -  GAD 7 : Generalized Anxiety Score 09/25/2020  Nervous, Anxious, on Edge 1  Control/stop worrying 0  Worry too much - different things 0  Trouble relaxing 1  Restless 0  Easily annoyed or irritable 0  Afraid - awful might happen 1  Total GAD 7 Score 3  Anxiety Difficulty Not difficult at all    BP Readings from Last 3 Encounters:  09/25/20 118/70  10/04/19 127/84  03/10/19 129/71    Physical Exam Vitals and nursing note reviewed.  Constitutional:      General: She is not in acute distress.    Appearance: Normal appearance. She is well-developed.  HENT:     Head: Normocephalic and atraumatic.  Cardiovascular:     Rate and Rhythm: Normal  rate and regular rhythm.  Pulmonary:     Effort: Pulmonary effort is normal. No respiratory distress.     Breath sounds: No wheezing or rhonchi.  Chest:       Comments: Redness and induration - no nipple discharge, no bleeding or drainage from the lesion, mild tenderness Lymphadenopathy:     Cervical: No cervical adenopathy.     Upper Body:     Right upper body: No supraclavicular, axillary or pectoral adenopathy.  Skin:    General: Skin is warm and dry.     Findings: No rash.  Neurological:     Mental Status: She is alert and oriented to person, place, and time.  Psychiatric:        Mood and Affect: Mood normal.        Behavior: Behavior normal.    Wt Readings from Last 3 Encounters:  09/25/20 142 lb (64.4 kg)  10/04/19 130 lb (59 kg)  03/10/19 143 lb (64.9 kg)    BP 118/70 (BP Location: Right Arm, Patient Position: Sitting, Cuff Size: Normal)   Pulse 60   Ht 5' (1.524 m)   Wt 142 lb (64.4 kg)   LMP  (Exact Date)   BMI 27.73 kg/m   Assessment and Plan: 1. Breast abscess Cellulitis and possible early abscess Warm compresses and Augmentin She can not afford an [place] or Dx Mammo today but insurance goes into effect November Call if sx do not resolve - amoxicillin-clavulanate (AUGMENTIN) 875-125 MG tablet; Take 1 tablet by mouth 2 (two) times daily for 10 days.  Dispense: 20 tablet; Refill: 0   Partially dictated using Animal nutritionist. Any errors are unintentional.  Bari Edward, MD [facility]  09/25/2020

## 2020-10-02 ENCOUNTER — Other Ambulatory Visit: Payer: Self-pay

## 2021-01-12 NOTE — L&D Delivery Note (Signed)
Delivery Summary for Dorthea Cove  Labor Events:   Preterm labor: No data found  Rupture date: 09/09/2021  Rupture time: 9:16 AM  Rupture type: Artificial Intact  Fluid Color: Clear  Induction: No data found  Augmentation: No data found  Complications: No data found  Cervical ripening: No data found No data found   No data found     Delivery:   Episiotomy: No data found  Lacerations: No data found  Repair suture: No data found  Repair # of packets: No data found  Blood loss (ml): 350   Information for the patient's newborn:  Cammie, Faulstich [[id number]]   Delivery 09/09/2021 10:51 PM by  C-Section, Low Transverse Sex:  female Gestational Age: [redacted]w[redacted]d Delivery Clinician:   Living?:         APGARS  One minute Five minutes Ten minutes  Skin color:        Heart rate:        Grimace:        Muscle tone:        Breathing:        Totals: 1  8      Presentation/position:      Resuscitation:   Cord information:    Disposition of cord blood:     Blood gases sent?  Complications:   Placenta: Delivered:       appearance Newborn Measurements: Weight: 6 lb 5.2 oz (2870 g)  Height: 20"  Head circumference:    Chest circumference:    Other providers:    Additional  information: Forceps:   Vacuum:   Breech:   Observed anomalies        See Dr. Oretha Milch operative note for details of C-section procedure.    Hildred Laser, MD [facility]

## 2021-01-31 ENCOUNTER — Other Ambulatory Visit: Payer: Self-pay

## 2021-01-31 ENCOUNTER — Encounter: Payer: Self-pay | Admitting: Internal Medicine

## 2021-01-31 ENCOUNTER — Ambulatory Visit (INDEPENDENT_AMBULATORY_CARE_PROVIDER_SITE_OTHER): Payer: BC Managed Care – PPO | Admitting: Internal Medicine

## 2021-01-31 VITALS — BP 118/70 | HR 90 | Ht 60.0 in | Wt 146.0 lb

## 2021-01-31 DIAGNOSIS — L723 Sebaceous cyst: Secondary | ICD-10-CM | POA: Diagnosis not present

## 2021-01-31 DIAGNOSIS — Z3A01 Less than 8 weeks gestation of pregnancy: Secondary | ICD-10-CM | POA: Diagnosis not present

## 2021-01-31 DIAGNOSIS — N912 Amenorrhea, unspecified: Secondary | ICD-10-CM | POA: Diagnosis not present

## 2021-01-31 LAB — POCT URINE PREGNANCY: Preg Test, Ur: POSITIVE — AB

## 2021-01-31 NOTE — Progress Notes (Signed)
Date:  01/31/2021   Name:  Kristina Kim   DOB:  1990/07/09   MRN:  [id number]   Chief Complaint: Cyst (Painful, Growing in size since 2 weeks ago. Always has been there as long as she knows.) and Amenorrhea (POSITIVE Tests at home. )  HPI Amenorrhea - last menses in December.  Tested home pregnancy last night positive.  Mass - cystic mass on right side for several weeks.  It is getting larger and is uncomfortable.  No bleeding or drainage.  Lab Results  Component Value Date   NA 138 01/17/2019   K 3.7 01/17/2019   CO2 22 01/17/2019   GLUCOSE 97 01/17/2019   BUN 8 01/17/2019   CREATININE 0.57 01/17/2019   CALCIUM 8.8 (L) 01/17/2019   GFRNONAA >60 01/17/2019   No results found for: CHOL, HDL, LDLCALC, LDLDIRECT, TRIG, CHOLHDL No results found for: TSH No results found for: HGBA1C Lab Results  Component Value Date   WBC 10.1 01/17/2019   HGB 11.8 (L) 01/17/2019   HCT 36.7 01/17/2019   MCV 84.8 01/17/2019   PLT 298 01/17/2019   Lab Results  Component Value Date   ALT 18 01/16/2019   AST 24 01/16/2019   ALKPHOS 81 01/16/2019   BILITOT 0.6 01/16/2019   No results found for: 25OHVITD2, 25OHVITD3, VD25OH   Review of Systems  Constitutional:  Negative for chills, fatigue and fever.  Respiratory:  Negative for cough, chest tightness and shortness of breath.   Cardiovascular:  Negative for chest pain, palpitations and leg swelling.  Gastrointestinal:  Negative for abdominal pain, constipation and nausea.  Psychiatric/Behavioral:  Negative for dysphoric mood. The patient is not nervous/anxious.    Patient Active Problem List   Diagnosis Date Noted   Breast abscess 01/16/2019   Abnormal cervical Papanicolaou smear 11/18/2014   Cervical dysplasia 08/02/2013    No Known Allergies  Past Surgical History:  Procedure Laterality Date   COLPOSCOPY  2015   abnormal Pap [facility]   INCISION AND DRAINAGE ABSCESS Right 03/10/2019   Procedure: INCISION AND DRAINAGE ABSCESS;   Surgeon: Sung Amabile, DO;  Location: [facility] ORS;  Service: General;  Laterality: Right;   INCISION AND DRAINAGE ABSCESS Right 10/04/2019   Procedure: INCISION AND DRAINAGE BREAST ABSCESS;  Surgeon: Sung Amabile, DO;  Location: [facility] ORS;  Service: General;  Laterality: Right;    Social History   Tobacco Use   Smoking status: Never   Smokeless tobacco: Never  Vaping Use   Vaping Use: Never used  Substance Use Topics   Alcohol use: Yes    Alcohol/week: 0.0 standard drinks    Comment: occassionally   Drug use: Yes    Types: Marijuana     Medication list has been reviewed and updated.  No outpatient medications have been marked as taking for the 01/31/21 encounter (Office Visit) with Reubin Milan, MD.    [facility] 2/9 Scores 01/31/2021 09/25/2020 04/26/2017 08/21/2015  PHQ - 2 Score 0 0 0 2  PHQ- 9 Score 2 2 0 5    GAD 7 : Generalized Anxiety Score 01/31/2021 09/25/2020  Nervous, Anxious, on Edge 1 1  Control/stop worrying 0 0  Worry too much - different things 0 0  Trouble relaxing 1 1  Restless 1 0  Easily annoyed or irritable 0 0  Afraid - awful might happen 0 1  Total GAD 7 Score 3 3  Anxiety Difficulty Not difficult at all Not difficult at all    BP Readings from  Last 3 Encounters:  01/31/21 118/70  09/25/20 118/70  10/04/19 127/84    Physical Exam Vitals and nursing note reviewed.  Constitutional:      General: She is not in acute distress.    Appearance: Normal appearance. She is well-developed.  HENT:     Head: Normocephalic and atraumatic.  Cardiovascular:     Rate and Rhythm: Normal rate and regular rhythm.     Pulses: Normal pulses.     Heart sounds: No murmur heard. Pulmonary:     Effort: Pulmonary effort is normal. No respiratory distress.     Breath sounds: No wheezing or rhonchi.  Abdominal:     General: Abdomen is flat.     Palpations: Abdomen is soft.  Musculoskeletal:     Cervical back: Normal range of motion and neck supple.  Skin:    General:  Skin is warm and dry.     Capillary Refill: Capillary refill takes less than 2 seconds.     Findings: No rash.          Comments: 2 cm cystic mass with central dark pinpoint. Soft, minimally tender, no drainage.  Skin surrounding slightly dark but no red or warm.  Neurological:     General: No focal deficit present.     Mental Status: She is alert and oriented to person, place, and time.  Psychiatric:        Mood and Affect: Mood normal.        Behavior: Behavior normal.    Wt Readings from Last 3 Encounters:  01/31/21 146 lb (66.2 kg)  09/25/20 142 lb (64.4 kg)  10/04/19 130 lb (59 kg)    BP 118/70    Pulse 90    Ht 5' (1.524 m)    Wt 146 lb (66.2 kg)    LMP 12/19/2020    SpO2 99%    BMI 28.51 kg/m   Assessment and Plan: 1. Less than [redacted] weeks gestation of pregnancy Recommend starting pre-natal vitamin daily Continue healthy diet and water daily. Limit caffeine; no nsaids or alcohol. - Ambulatory referral to Obstetrics / Gynecology  2. Amenorrhea - POCT urine pregnancy  3. Sebaceous cyst Warm compresses for now No antibiotics due to early pregnancy   Partially dictated using Animal nutritionist. Any errors are unintentional.  Bari Edward, MD [facility]  01/31/2021

## 2021-01-31 NOTE — Patient Instructions (Addendum)
Prenatal vitamin once a day  **Folic acid/folate 1 mg  Tylenol only - no ibuprofen, advil, naproxen, aleve  No alcohol, no smoking  [facility].

## 2021-02-17 ENCOUNTER — Ambulatory Visit (INDEPENDENT_AMBULATORY_CARE_PROVIDER_SITE_OTHER): Payer: BC Managed Care – PPO | Admitting: Licensed Practical Nurse

## 2021-02-17 ENCOUNTER — Other Ambulatory Visit: Payer: Self-pay

## 2021-02-17 ENCOUNTER — Encounter: Payer: Self-pay | Admitting: Licensed Practical Nurse

## 2021-02-17 ENCOUNTER — Other Ambulatory Visit (HOSPITAL_COMMUNITY)
Admission: RE | Admit: 2021-02-17 | Discharge: 2021-02-17 | Disposition: A | Discharge status: Home / Self Care | Payer: BC Managed Care – PPO | Source: Ambulatory Visit | Attending: Licensed Practical Nurse | Admitting: Licensed Practical Nurse

## 2021-02-17 ENCOUNTER — Ambulatory Visit
Admission: RE | Admit: 2021-02-17 | Discharge: 2021-02-17 | Disposition: A | Discharge status: Home / Self Care | Payer: BC Managed Care – PPO | Source: Ambulatory Visit | Attending: Licensed Practical Nurse | Admitting: Licensed Practical Nurse

## 2021-02-17 VITALS — BP 126/80 | Wt 160.0 lb

## 2021-02-17 DIAGNOSIS — Z124 Encounter for screening for malignant neoplasm of cervix: Secondary | ICD-10-CM

## 2021-02-17 DIAGNOSIS — Z113 Encounter for screening for infections with a predominantly sexual mode of transmission: Secondary | ICD-10-CM | POA: Diagnosis not present

## 2021-02-17 DIAGNOSIS — R109 Unspecified abdominal pain: Secondary | ICD-10-CM | POA: Insufficient documentation

## 2021-02-17 DIAGNOSIS — Z3400 Encounter for supervision of normal first pregnancy, unspecified trimester: Secondary | ICD-10-CM

## 2021-02-17 DIAGNOSIS — O26891 Other specified pregnancy related conditions, first trimester: Secondary | ICD-10-CM | POA: Diagnosis not present

## 2021-02-17 DIAGNOSIS — Z3A08 8 weeks gestation of pregnancy: Secondary | ICD-10-CM | POA: Diagnosis not present

## 2021-02-17 DIAGNOSIS — Z3480 Encounter for supervision of other normal pregnancy, unspecified trimester: Secondary | ICD-10-CM

## 2021-02-17 NOTE — Progress Notes (Signed)
NOB today. LMP 02/09/2020 

## 2021-02-17 NOTE — Progress Notes (Signed)
New Obstetric Patient H&P    Chief Complaint: "Desires prenatal care"   History of Present Illness: Here with [name]. Patient is a 31 y.o. G1P0000 African American female, presents with amenorrhea and positive home pregnancy test. Patient's last menstrual period was 12/19/2020 (exact date). and based on her  LMP, her EDD is Estimated Date of Delivery: 09/25/21 and her EGA is [redacted]w[redacted]d. Cycles are 4. days, regular, and occur approximately every :  28-30  days. Her last pap smear was 5 years ago and was no abnormalities.    Since her LMP she claims she has experienced fatigue, breast tenderness, frequent urination, change in appetite and hot flashes. She denies vaginal bleeding. Her past medical history is contibutory Anxiety , previous MJ use, and asthma. Her prior pregnancies are notable for none  Anxiety: has tried therapy, but currently not using it, no medications.   Asthma: in childhood, no meds or recent hospitalizations Surgery to right breast: had cyst removed on right breast, scar present, nipple intact.  (Reviewed anatomically she should not have an issue with breastfeeding)  Since her LMP, she admits to the use of tobacco products  no Stopped MJ once  pregnant  She claims she has gained   no pounds since the start of her pregnancy.  There are cats in the home in the home  no  She admits close contact with children on a regular basis  no  She has had chicken pox in the past yes She has had Tuberculosis exposures, symptoms, or previously tested positive for TB   no Current or past history of domestic violence. Not assessed, partner present   Genetic Screening/Teratology Counseling: (Includes patient, baby's father, or anyone in either family with:)  Both she and [name] are African American   1. Patient's age >/= 54 at [facility]  no 2. Thalassemia ([place], [place], Mediterranean, or Asian background): MCV<80  no 3. Neural tube defect (meningomyelocele, spina bifida, anencephaly)  no 4.  Congenital heart defect  no  5. Down syndrome  no 6. Tay-Sachs (Jewish, [place]  no 7. Canavan's Disease  no 8. Sickle cell disease or trait (African)  no  9. Hemophilia or other blood disorders  no  10. Muscular dystrophy  no  11. Cystic fibrosis  no  12. Huntington's Chorea  no  13. Mental retardation/autism  no 14. Other inherited genetic or chromosomal disorder  no 15. Maternal metabolic disorder (DM, PKU, etc)  no 16. Patient or FOB with a child with a birth defect not listed above no  16a. Patient or FOB with a birth defect themselves no 17. Recurrent pregnancy loss, or stillbirth  no  18. Any medications since LMP other than prenatal vitamins (include vitamins, supplements, OTC meds, drugs, alcohol)  no 19. Any other genetic/environmental exposure to discuss  no  Infection History:   1. Lives with someone with TB or TB exposed  no  2. Patient or partner has history of genital herpes  no 3. Rash or viral illness since LMP  no 4. History of STI (GC, CT, HPV, syphilis, HIV)  no 5. History of recent travel :  no  Other pertinent information:   Works at a call center Enjoys walking for exercise Trying to eat a healthy balanced diet    Review of Systems:10 point review of systems negative unless otherwise noted in HPI  Past Medical History:  Patient Active Problem List   Diagnosis Date Noted   Breast abscess 01/16/2019   Abnormal cervical  Papanicolaou smear 11/18/2014    [facility]    Cervical dysplasia 08/02/2013    Formatting of this note might be different from the original. - First abnormal pap smear 06/02/2013 HSIL/LSIL, HPV+ - 08/02/2013 colposcopy     Past Surgical History:  Past Surgical History:  Procedure Laterality Date   COLPOSCOPY  2015   abnormal Pap [facility]   INCISION AND DRAINAGE ABSCESS Right 03/10/2019   Procedure: INCISION AND DRAINAGE ABSCESS;  Surgeon: Sung Amabile, DO;  Location: [facility] OR[facility];  Service: General;  Laterality: Right;    INCISION AND DRAINAGE ABSCESS Right 10/04/2019   Procedure: INCISION AND DRAINAGE BREAST ABSCESS;  Surgeon: Sung Amabile, DO;  Location: [facility] ORS;  Service: General;  Laterality: Right;    Gynecologic History: Patient's last menstrual period was 12/19/2020 (exact date).  Obstetric History: G1P0000  Family History:  Family History  Problem Relation Age of Onset   Breast cancer Paternal Grandmother    Cancer Maternal Grandmother 38       vaginal    Social History:  Social History   Socioeconomic History   Marital status: Single    Spouse name: Not on file   Number of children: Not on file   Years of education: Not on file   Highest education level: Not on file  Occupational History   Not on file  Tobacco Use   Smoking status: Never   Smokeless tobacco: Never  Vaping Use   Vaping Use: Never used  Substance and Sexual Activity   Alcohol use: Not Currently    Comment: occassionally   Drug use: Not Currently    Types: Marijuana   Sexual activity: Yes    Birth control/protection: None    Comment: Irregular periods in past  Other Topics Concern   Not on file  Social History Narrative   Not on file   Social Determinants of Health   Financial Resource Strain: Not on file  Food Insecurity: Not on file  Transportation Needs: Not on file  Physical Activity: Not on file  Stress: Not on file  Social Connections: Not on file  Intimate Partner Violence: Not on file    Allergies:  No Known Allergies  Medications: Prior to Admission medications   Medication Sig Start Date End Date Taking? Authorizing Provider  loratadine (CLARITIN) 10 MG tablet Take 10 mg by mouth daily.   Yes [provider]    Physical Exam Vitals: Blood pressure 126/80, weight 160 lb (72.6 kg), last menstrual period 12/19/2020.  General: NAD HEENT: normocephalic, anicteric Thyroid: no enlargement, no palpable nodules Breasts:Right; soft no masses or redness, scar to the left of nipple  that makes an indentation, Nipple erect and intact.  Left: soft, no masses or redness, nipple erect and intact. Pulmonary: No increased work of breathing, CTAB Cardiovascular: RRR, distal pulses 2+ Abdomen: NABS, soft, non-tender, non-distended.  Umbilicus without lesions.  No hepatomegaly, splenomegaly or masses palpable. No evidence of hernia  Genitourinary:  External: Normal external female genitalia.  Normal urethral meatus, normal  Bartholin's and Skene's glands.    Vagina: Normal vaginal mucosa, no evidence of prolapse.    Cervix: Grossly normal in appearance, no bleeding  Uterus: unable to determine size d/t habitus and pt's discomfort. No CMT   Adnexa: 3cm palpable mass in Right lower abd, painful when touched  Rectal: deferred Extremities: no edema, erythema, or tenderness Neurologic: Grossly intact Psychiatric: mood appropriate, affect full   Assessment: 31 y.o. G1P0000 at [redacted]w[redacted]d presenting to initiate prenatal  care  Plan: 1) Avoid alcoholic beverages. 2) Patient encouraged not to smoke.  3) Discontinue the use of all non-medicinal drugs and chemicals.  4) Take prenatal vitamins daily.  5) Nutrition, food safety (fish, cheese advisories, and high nitrite foods) and exercise discussed. 6) Hospital and practice style discussed with cross coverage system.  7) Genetic Screening, such as with 1st Trimester Screening, cell free fetal DNA, AFP testing, and Ultrasound, as well as with amniocentesis and CVS as appropriate, is discussed with patient. At the conclusion of today's visit patient undecided genetic testing 8) Patient is asked about travel to areas at risk for the [place] virus, and counseled to avoid travel and exposure to mosquitoes or sexual partners who may have themselves been exposed to the virus. Testing is discussed, and will be ordered as appropriate.  9) Pt went immediately to [facility] for stat US given pain and mass felt in lower abd on exam to r/o ectopic.  10) will return  at a later date for NOB labs    Carie Caddy, CNM  [facility], [facility] 02/17/2021, 6:08 PM

## 2021-02-19 LAB — URINE CULTURE

## 2021-02-19 LAB — CYTOLOGY - PAP
Chlamydia: NEGATIVE
Comment: NEGATIVE
Comment: NEGATIVE
Comment: NORMAL
Diagnosis: NEGATIVE
Neisseria Gonorrhea: NEGATIVE
Trichomonas: NEGATIVE

## 2021-02-20 ENCOUNTER — Encounter: Payer: Self-pay | Admitting: Licensed Practical Nurse

## 2021-02-20 DIAGNOSIS — Z3490 Encounter for supervision of normal pregnancy, unspecified, unspecified trimester: Secondary | ICD-10-CM | POA: Insufficient documentation

## 2021-02-20 DIAGNOSIS — Z3480 Encounter for supervision of other normal pregnancy, unspecified trimester: Secondary | ICD-10-CM | POA: Insufficient documentation

## 2021-03-19 ENCOUNTER — Other Ambulatory Visit: Payer: Self-pay

## 2021-03-19 ENCOUNTER — Ambulatory Visit (INDEPENDENT_AMBULATORY_CARE_PROVIDER_SITE_OTHER): Payer: BC Managed Care – PPO | Admitting: Obstetrics

## 2021-03-19 VITALS — BP 144/70 | Wt 168.0 lb

## 2021-03-19 DIAGNOSIS — Z3400 Encounter for supervision of normal first pregnancy, unspecified trimester: Secondary | ICD-10-CM | POA: Diagnosis not present

## 2021-03-19 DIAGNOSIS — Z3A12 12 weeks gestation of pregnancy: Secondary | ICD-10-CM

## 2021-03-19 DIAGNOSIS — O161 Unspecified maternal hypertension, first trimester: Secondary | ICD-10-CM

## 2021-03-19 DIAGNOSIS — Z3480 Encounter for supervision of other normal pregnancy, unspecified trimester: Secondary | ICD-10-CM

## 2021-03-19 DIAGNOSIS — O169 Unspecified maternal hypertension, unspecified trimester: Secondary | ICD-10-CM | POA: Insufficient documentation

## 2021-03-19 LAB — POCT URINALYSIS DIPSTICK OB

## 2021-03-19 NOTE — Progress Notes (Signed)
No concerns. 

## 2021-03-19 NOTE — Progress Notes (Signed)
Routine Prenatal Care Visit ? ?Subjective  ?Kristina Kim is a 31 y.o. G1P0000 at [redacted]w[redacted]d being seen today for ongoing prenatal care.  She is currently monitored for the following issues for this high-risk pregnancy and has Abnormal cervical Papanicolaou smear; Breast abscess; Cervical dysplasia; Supervision of other normal pregnancy, antepartum; and Hypertension affecting pregnancy on their problem list.  ?----------------------------------------------------------------------------------- ?Patient reports no complaints.   ? .  .   . Leaking Fluid denies.  ?----------------------------------------------------------------------------------- ?The following portions of the patient's history were reviewed and updated as appropriate: allergies, current medications, past family history, past medical history, past social history, past surgical history and problem list. Problem list updated. ? ?Objective  ?Blood pressure (!) 144/70, weight 168 lb (76.2 kg), last menstrual period 12/19/2020. ?Pregravid weight 160 lb (72.6 kg) Total Weight Gain 8 lb (3.629 kg) ?Urinalysis: Urine Protein Trace  Urine Glucose (!) Trace ? ?Fetal Status:          ? ?General:  Alert, oriented and cooperative. Patient is in no acute distress.  ?Skin: Skin is warm and dry. No rash noted.   ?Cardiovascular: Normal heart rate noted  ?Respiratory: Normal respiratory effort, no problems with respiration noted  ?Abdomen: Soft, gravid, appropriate for gestational age.       ?Pelvic:  Cervical exam deferred        ?Extremities: Normal range of motion.     ?Mental Status: Normal mood and affect. Normal behavior. Normal judgment and thought content.  ? ?Assessment  ? ?31 y.o. G1P0000 at [redacted]w[redacted]d by  09/25/2021, by Last Menstrual Period presenting for routine prenatal visit ? ?Plan  ? ?pregnancy Problems (from 02/17/21 to present)   ? Problem Noted Resolved  ? Hypertension affecting pregnancy 03/19/2021 by Imagene Riches, CNM No  ? Overview Signed 03/19/2021 10:13 AM  by Imagene Riches, CNM  ?  Noted at [place] ?  ?  ? Supervision of other normal pregnancy, antepartum 02/20/2021 by Allen Derry, CNM No  ? Overview Addendum 03/19/2021 10:12 AM by Imagene Riches, CNM  ?   ?Nursing Staff Provider  ?Office Location  [facility] Dating  [redacted]w[redacted]d on 2/6   ?Language  English  Anatomy US    ?Flu Vaccine   Genetic Screen  NIPS:   ?TDaP vaccine    Hgb A1C or  ?GTT Early : ?Third trimester :   ?Covid    LAB RESULTS   ?Rhogam   Blood Type     ?Feeding Plan  Antibody    ?Contraception  Rubella    ?Circumcision  RPR     ?Pediatrician   HBsAg     ?Support Person Merrilee Seashore HIV    ?Prenatal Classes  Varicella   ?  GBS  (For PCN allergy, check sensitivities)   ?BTL Consent     ?VBAC Consent  Pap    ?  Hgb Electro    ?Pelvis Tested  CF   ?   SMA   ?     ? ? ?  ?  ?  ?  ? ?Preterm labor symptoms and general obstetric precautions including but not limited to vaginal bleeding, contractions, leaking of fluid and fetal movement were reviewed in detail with the patient. ?Please refer to After Visit Summary for other counseling recommendations.  ?Discussed keeping her wt gain limited this pregnancy. ?Also addressed previous MJ use. FOB is user- advised to avoid. ? ? ?No follow-ups on file. ? ?Imagene Riches, CNM  ?03/19/2021 11:57 AM   ? ?

## 2021-03-20 LAB — HCV INTERPRETATION

## 2021-03-20 LAB — CBC/D/PLT+RPR+RH+ABO+RUBIGG...
Antibody Screen: NEGATIVE
Basophils Absolute: 0 10*3/uL (ref 0.0–0.2)
Basos: 0 %
EOS (ABSOLUTE): 0.2 10*3/uL (ref 0.0–0.4)
Eos: 1 %
HCV Ab: NONREACTIVE
HIV Screen 4th Generation wRfx: NONREACTIVE
Hematocrit: 34.7 % (ref 34.0–46.6)
Hemoglobin: 11.6 g/dL (ref 11.1–15.9)
Hepatitis B Surface Ag: NEGATIVE
Immature Grans (Abs): 0.2 10*3/uL — ABNORMAL HIGH (ref 0.0–0.1)
Immature Granulocytes: 2 %
Lymphocytes Absolute: 1.9 10*3/uL (ref 0.7–3.1)
Lymphs: 14 %
MCH: 28 pg (ref 26.6–33.0)
MCHC: 33.4 g/dL (ref 31.5–35.7)
MCV: 84 fL (ref 79–97)
Monocytes Absolute: 0.9 10*3/uL (ref 0.1–0.9)
Monocytes: 6 %
Neutrophils Absolute: 10.3 10*3/uL — ABNORMAL HIGH (ref 1.4–7.0)
Neutrophils: 77 %
Platelets: 361 10*3/uL (ref 150–450)
RBC: 4.15 x10E6/uL (ref 3.77–5.28)
RDW: 13.3 % (ref 11.7–15.4)
RPR Ser Ql: NONREACTIVE
Rh Factor: POSITIVE
Rubella Antibodies, IGG: 3.5 index (ref >0.99)
Varicella zoster IgG: <135 index — ABNORMAL LOW (ref >165)
WBC: 13.4 10*3/uL — ABNORMAL HIGH (ref 3.4–10.8)

## 2021-03-20 LAB — COMPREHENSIVE METABOLIC PANEL
ALT: 20 IU/L (ref 0–32)
AST: 15 IU/L (ref 0–40)
Albumin/Globulin Ratio: 1.7 (ref 1.2–2.2)
Albumin: 4.6 g/dL (ref 3.9–5.0)
Alkaline Phosphatase: 78 IU/L (ref 44–121)
BUN/Creatinine Ratio: 8 — ABNORMAL LOW (ref 9–23)
BUN: 4 mg/dL — ABNORMAL LOW (ref 6–20)
Bilirubin Total: <0.2 mg/dL (ref 0.0–1.2)
CO2: 17 mmol/L — ABNORMAL LOW (ref 20–29)
Calcium: 9.4 mg/dL (ref 8.7–10.2)
Chloride: 102 mmol/L (ref 96–106)
Creatinine, Ser: 0.5 mg/dL — ABNORMAL LOW (ref 0.57–1.00)
Globulin, Total: 2.7 g/dL (ref 1.5–4.5)
Glucose: 70 mg/dL (ref 70–99)
Potassium: 3.6 mmol/L (ref 3.5–5.2)
Sodium: 136 mmol/L (ref 134–144)
Total Protein: 7.3 g/dL (ref 6.0–8.5)
eGFR: 130 mL/min/{1.73_m2} (ref >59)

## 2021-03-20 NOTE — Addendum Note (Signed)
Addended by: Kathlene Cote on: 03/20/2021 10:32 AM ? ? Modules accepted: Orders ? ?

## 2021-04-17 ENCOUNTER — Encounter: Payer: Self-pay | Admitting: Advanced Practice Midwife

## 2021-04-17 ENCOUNTER — Ambulatory Visit (INDEPENDENT_AMBULATORY_CARE_PROVIDER_SITE_OTHER): Payer: BC Managed Care – PPO | Admitting: Advanced Practice Midwife

## 2021-04-17 VITALS — BP 128/60 | Wt 167.0 lb

## 2021-04-17 DIAGNOSIS — Z3A17 17 weeks gestation of pregnancy: Secondary | ICD-10-CM

## 2021-04-17 DIAGNOSIS — Z3400 Encounter for supervision of normal first pregnancy, unspecified trimester: Secondary | ICD-10-CM

## 2021-04-17 LAB — POCT URINALYSIS DIPSTICK OB
Glucose, UA: NEGATIVE
POC,PROTEIN,UA: NEGATIVE

## 2021-04-17 NOTE — Progress Notes (Signed)
Routine Prenatal Care Visit ? ?Subjective  ?Kristina Kim is a 31 y.o. G1P0000 at [redacted]w[redacted]d being seen today for ongoing prenatal care.  She is currently monitored for the following issues for this low-risk pregnancy and has Abnormal cervical Papanicolaou smear; Breast abscess; Cervical dysplasia; Supervision of normal pregnancy; and Hypertension affecting pregnancy on their problem list.  ?----------------------------------------------------------------------------------- ?Patient reports mild pelvic pain- no cramping.   ?Contractions: Not present. Vag. Bleeding: None.  Movement: Absent. Leaking Fluid denies.  ?----------------------------------------------------------------------------------- ?The following portions of the patient's history were reviewed and updated as appropriate: allergies, current medications, past family history, past medical history, past social history, past surgical history and problem list. Problem list updated. ? ?Objective  ?Blood pressure 128/60, weight 167 lb (75.8 kg), last menstrual period 12/19/2020. ?Pregravid weight 160 lb (72.6 kg) Total Weight Gain 7 lb (3.175 kg) ?Urinalysis: Urine Protein    Urine Glucose   ? ?Fetal Status: Fetal Heart Rate (bpm): 145   Movement: Absent    ? ?General:  Alert, oriented and cooperative. Patient is in no acute distress.  ?Skin: Skin is warm and dry. No rash noted.   ?Cardiovascular: Normal heart rate noted  ?Respiratory: Normal respiratory effort, no problems with respiration noted  ?Abdomen: Soft, gravid, appropriate for gestational age. Pain/Pressure: Present     ?Pelvic:  Cervical exam deferred        ?Extremities: Normal range of motion.  Edema: None  ?Mental Status: Normal mood and affect. Normal behavior. Normal judgment and thought content.  ? ?Assessment  ? ?31 y.o. G1P0000 at [redacted]w[redacted]d by  09/25/2021, by Last Menstrual Period presenting for routine prenatal visit ? ?Plan  ? ?pregnancy Problems (from 02/17/21 to present)   ? Problem Noted Resolved   ? Hypertension affecting pregnancy 03/19/2021 by [name], CNM No  ? Overview Signed 03/19/2021 10:13 AM by [name], CNM  ?  Noted at NOB ?  ?  ? Supervision of normal pregnancy 02/20/2021 by Ellwood Sayers, CNM No  ? Overview Addendum 03/19/2021 10:12 AM by [name], CNM  ?   ?Nursing Staff Provider  ?Office Location  [facility] Dating  [redacted]w[redacted]d on 2/6   ?Language  English  Anatomy US    ?Flu Vaccine   Genetic Screen  NIPS:   ?TDaP vaccine    Hgb A1C or  ?GTT Early : ?Third trimester :   ?Covid    LAB RESULTS   ?Rhogam   Blood Type     ?Feeding Plan  Antibody    ?Contraception  Rubella    ?Circumcision  RPR     ?Pediatrician   HBsAg     ?Support Person [name] HIV    ?Prenatal Classes  Varicella   ?  GBS  (For PCN allergy, check sensitivities)   ?BTL Consent     ?VBAC Consent  Pap    ?  Hgb Electro    ?Pelvis Tested  CF   ?   SMA   ?     ? ? ?  ?  ?  ?  ? ?Preterm labor symptoms and general obstetric precautions including but not limited to vaginal bleeding, contractions, leaking of fluid and fetal movement were reviewed in detail with the patient. ?Please refer to After Visit Summary for other counseling recommendations.  ? ?Return in about 3 weeks (around 05/08/2021) for rob after (mfm visit 4/18). ? ?Tresea Mall, CNM ?04/17/2021 9:19 AM   ? ?

## 2021-04-17 NOTE — Patient Instructions (Signed)
Second Trimester of Pregnancy ?The second trimester of pregnancy is from week 13 through week 27. This is months 4 through 6 of pregnancy. The second trimester is often a time when you feel your best. Your body has adjusted to being pregnant, and you begin to feel better physically. ?During the second trimester: ?Morning sickness has lessened or stopped completely. ?You may have more energy. ?You may have an increase in appetite. ?The second trimester is also a time when the unborn baby (fetus) is growing rapidly. At the end of the sixth month, the fetus may be up to 12 inches long and weigh about 1? pounds. You will likely begin to feel the baby move (quickening) between 16 and 20 weeks of pregnancy. ?Body changes during your second trimester ?Your body continues to go through many changes during your second trimester. The changes vary and generally return to normal after the baby is born. ?Physical changes ?Your weight will continue to increase. You will notice your lower abdomen bulging out. ?You may begin to get stretch marks on your hips, abdomen, and breasts. ?Your breasts will continue to grow and to become tender. ?Dark spots or blotches (chloasma or mask of pregnancy) may develop on your face. ?A dark line from your belly button to the pubic area (linea nigra) may appear. ?You may have changes in your hair. These can include thickening of your hair, rapid growth, and changes in texture. Some people also have hair loss during or after pregnancy, or hair that feels dry or thin. ?Health changes ?You may develop headaches. ?You may have heartburn. ?You may develop constipation. ?You may develop hemorrhoids or swollen, bulging veins (varicose veins). ?Your gums may bleed and may be sensitive to brushing and flossing. ?You may urinate more often because the fetus is pressing on your bladder. ?You may have back pain. This is caused by: ?Weight gain. ?Pregnancy hormones that are relaxing the joints in your  pelvis. ?A shift in weight and the muscles that support your balance. ?Follow these instructions at home: ?Medicines ?Follow your health care provider's instructions regarding medicine use. Specific medicines may be either safe or unsafe to take during pregnancy. Do not take any medicines unless approved by your health care provider. ?Take a prenatal vitamin that contains at least 600 micrograms (mcg) of folic acid. ?Eating and drinking ?Eat a healthy diet that includes fresh fruits and vegetables, whole grains, good sources of protein such as meat, eggs, or tofu, and low-fat dairy products. ?Avoid raw meat and unpasteurized juice, milk, and cheese. These carry germs that can harm you and your baby. ?You may need to take these actions to prevent or treat constipation: ?Drink enough fluid to keep your urine pale yellow. ?Eat foods that are high in fiber, such as beans, whole grains, and fresh fruits and vegetables. ?Limit foods that are high in fat and processed sugars, such as fried or sweet foods. ?Activity ?Exercise only as directed by your health care provider. Most people can continue their usual exercise routine during pregnancy. Try to exercise for 30 minutes at least 5 days a week. Stop exercising if you develop contractions in your uterus. ?Stop exercising if you develop pain or cramping in the lower abdomen or lower back. ?Avoid exercising if it is very hot or humid or if you are at a high altitude. ?Avoid heavy lifting. ?If you choose to, you may have sex unless your health care provider tells you not to. ?Relieving pain and discomfort ?Wear a supportive bra   to prevent discomfort from breast tenderness. ?Take warm sitz baths to soothe any pain or discomfort caused by hemorrhoids. Use hemorrhoid cream if your health care provider approves. ?Rest with your legs raised (elevated) if you have leg cramps or low back pain. ?If you develop varicose veins: ?Wear support hose as told by your health care  provider. ?Elevate your feet for 15 minutes, 3-4 times a day. ?Limit salt in your diet. ?Safety ?Wear your seat belt at all times when driving or riding in a car. ?Talk with your health care provider if someone is verbally or physically abusive to you. ?Lifestyle ?Do not use hot tubs, steam rooms, or saunas. ?Do not douche. Do not use tampons or scented sanitary pads. ?Avoid cat litter boxes and soil used by cats. These carry germs that can cause birth defects in the baby and possibly loss of the fetus by miscarriage or stillbirth. ?Do not use herbal remedies, alcohol, illegal drugs, or medicines that are not approved by your health care provider. Chemicals in these products can harm your baby. ?Do not use any products that contain nicotine or tobacco, such as cigarettes, e-cigarettes, and chewing tobacco. If you need help quitting, ask your health care provider. ?General instructions ?During a routine prenatal visit, your health care provider will do a physical exam and other tests. He or she will also discuss your overall health. Keep all follow-up visits. This is important. ?Ask your health care provider for a referral to a local prenatal education class. ?Ask for help if you have counseling or nutritional needs during pregnancy. Your health care provider can offer advice or refer you to specialists for help with various needs. ?Where to find more information ?American Pregnancy Association: americanpregnancy.org ?[employer]: https://www.todd-brady.net/ ?Office on Women's Health: MightyReward.co.nz ?Contact a health care provider if you have: ?A headache that does not go away when you take medicine. ?Vision changes or you see spots in front of your eyes. ?Mild pelvic cramps, pelvic pressure, or nagging pain in the abdominal area. ?Persistent nausea, vomiting, or diarrhea. ?A bad-smelling vaginal discharge or foul-smelling urine. ?Pain when you  urinate. ?Sudden or extreme swelling of your face, hands, ankles, feet, or legs. ?A fever. ?Get help right away if you: ?Have fluid leaking from your vagina. ?Have spotting or bleeding from your vagina. ?Have severe abdominal cramping or pain. ?Have difficulty breathing. ?Have chest pain. ?Have fainting spells. ?Have not felt your baby move for the time period told by your health care provider. ?Have new or increased pain, swelling, or redness in an arm or leg. ?Summary ?The second trimester of pregnancy is from week 13 through week 27 (months 4 through 6). ?Do not use herbal remedies, alcohol, illegal drugs, or medicines that are not approved by your health care provider. Chemicals in these products can harm your baby. ?Exercise only as directed by your health care provider. Most people can continue their usual exercise routine during pregnancy. ?Keep all follow-up visits. This is important. ?This information is not intended to replace advice given to you by your health care provider. Make sure you discuss any questions you have with your health care provider. ?Document Revised: 06/07/2019 Document Reviewed: 04/13/2019 ?Elsevier Patient Education ? 2022 Elsevier Inc. ?Exercise During Pregnancy ?Exercise is an important part of being healthy for people of all ages. Exercise improves the function of your heart and lungs and helps you maintain strength, flexibility, and a healthy body weight. Exercise also boosts energy levels and elevates mood. ?  Most women should exercise regularly during pregnancy. Exercise routines may need to change as your pregnancy progresses. In rare cases, women with certain medical conditions or complications may be asked to limit or avoid exercise during pregnancy. Your health care provider will give you information on what will work for you. ?How does this affect me? ?Along with maintaining general strength and flexibility, exercising during pregnancy can help: ?Keep strength in muscles  that are used during labor and childbirth. ?Decrease low back pain or symptoms of depression. ?Control weight gain during pregnancy. ?Reduce the risk of needing insulin if you develop diabetes during pregnancy. ?Decrease the risk of

## 2021-04-24 ENCOUNTER — Other Ambulatory Visit: Payer: Self-pay

## 2021-04-24 DIAGNOSIS — F1991 Other psychoactive substance use, unspecified, in remission: Secondary | ICD-10-CM

## 2021-04-24 DIAGNOSIS — R87619 Unspecified abnormal cytological findings in specimens from cervix uteri: Secondary | ICD-10-CM

## 2021-04-24 DIAGNOSIS — O162 Unspecified maternal hypertension, second trimester: Secondary | ICD-10-CM

## 2021-04-24 DIAGNOSIS — N879 Dysplasia of cervix uteri, unspecified: Secondary | ICD-10-CM

## 2021-04-29 ENCOUNTER — Other Ambulatory Visit: Payer: Self-pay

## 2021-04-29 ENCOUNTER — Ambulatory Visit: Payer: BC Managed Care – PPO | Attending: Obstetrics and Gynecology

## 2021-04-29 VITALS — BP 126/80 | HR 112 | Temp 99.0°F | Ht 60.0 in | Wt 171.5 lb

## 2021-04-29 DIAGNOSIS — O99212 Obesity complicating pregnancy, second trimester: Secondary | ICD-10-CM | POA: Insufficient documentation

## 2021-04-29 DIAGNOSIS — Z3A18 18 weeks gestation of pregnancy: Secondary | ICD-10-CM | POA: Diagnosis not present

## 2021-04-29 DIAGNOSIS — Z363 Encounter for antenatal screening for malformations: Secondary | ICD-10-CM

## 2021-04-29 DIAGNOSIS — E669 Obesity, unspecified: Secondary | ICD-10-CM | POA: Diagnosis not present

## 2021-04-29 DIAGNOSIS — O161 Unspecified maternal hypertension, first trimester: Secondary | ICD-10-CM

## 2021-04-29 DIAGNOSIS — Z3400 Encounter for supervision of normal first pregnancy, unspecified trimester: Secondary | ICD-10-CM

## 2021-05-08 ENCOUNTER — Encounter: Payer: Self-pay | Admitting: Licensed Practical Nurse

## 2021-05-08 ENCOUNTER — Ambulatory Visit (INDEPENDENT_AMBULATORY_CARE_PROVIDER_SITE_OTHER): Payer: BC Managed Care – PPO | Admitting: Licensed Practical Nurse

## 2021-05-08 VITALS — BP 126/82 | Wt 174.0 lb

## 2021-05-08 DIAGNOSIS — Z3400 Encounter for supervision of normal first pregnancy, unspecified trimester: Secondary | ICD-10-CM | POA: Diagnosis not present

## 2021-05-08 DIAGNOSIS — Z3A20 20 weeks gestation of pregnancy: Secondary | ICD-10-CM

## 2021-05-08 NOTE — Progress Notes (Signed)
Routine Prenatal Care Visit ? ?Subjective  ?Kristina Kim is a 31 y.o. G1P0000 at [redacted]w[redacted]d being seen today for ongoing prenatal care.  She is currently monitored for the following issues for this low-risk pregnancy and has Abnormal cervical Papanicolaou smear; Breast abscess; Cervical dysplasia; Supervision of normal pregnancy; and Hypertension affecting pregnancy on their problem list.  ?----------------------------------------------------------------------------------- ?Patient reports no complaints.  Here with [name], desires to breastfeed but is worried about the cyst on her breast.  1-2cm area palpated left of nipple on Right breast, discussed this should not interfere with breastfeeding.  ?Having gender reveal party next month.  ? ?Contractions: Not present. Vag. Bleeding: None.  Movement: Present. Leaking Fluid denies.  ?----------------------------------------------------------------------------------- ?The following portions of the patient's history were reviewed and updated as appropriate: allergies, current medications, past family history, past medical history, past social history, past surgical history and problem list. Problem list updated. ? ?Objective  ?Blood pressure 126/82, weight 174 lb (78.9 kg), last menstrual period 12/19/2020. ?Pregravid weight 160 lb (72.6 kg) Total Weight Gain 14 lb (6.35 kg) ?Urinalysis: Urine Protein    Urine Glucose   ? ?Fetal Status: Fetal Heart Rate (bpm): 140   Movement: Present    ? ?General:  Alert, oriented and cooperative. Patient is in no acute distress.  ?Skin: Skin is warm and dry. No rash noted.   ?Cardiovascular: Normal heart rate noted  ?Respiratory: Normal respiratory effort, no problems with respiration noted  ?Abdomen: Soft, gravid, appropriate for gestational age. Pain/Pressure: Absent     ?Pelvic:  Cervical exam deferred        ?Extremities: Normal range of motion.  Edema: None  ?Mental Status: Normal mood and affect. Normal behavior. Normal judgment and  thought content.  ? ?Assessment  ? ?31 y.o. G1P0000 at [redacted]w[redacted]d by  09/25/2021, by Last Menstrual Period presenting for routine prenatal visit ? ?Plan  ? ?pregnancy Problems (from 02/17/21 to present)   ? ? Problem Noted Resolved  ? Hypertension affecting pregnancy 03/19/2021 by [name], CNM No  ? Overview Signed 03/19/2021 10:13 AM by [name], CNM  ?  Noted at NOB ?  ?  ? Supervision of normal pregnancy 02/20/2021 by Ellwood Sayers, CNM No  ? Overview Addendum 05/08/2021 10:04 AM by Ellwood Sayers, CNM  ?   ?Nursing Staff Provider  ?Office Location  [facility] Dating  [redacted]w[redacted]d on 2/6   ?Language  English  Anatomy US  Normal anatomy. Does not know gender  ?Flu Vaccine   Genetic Screen  NIPS:   ?TDaP vaccine    Hgb A1C or  ?GTT Early : ?Third trimester :   ?Covid    LAB RESULTS   ?Rhogam  NA Blood Type B/Positive/-- (03/08 1040)   ?Feeding Plan Breast  Antibody Negative (03/08 1040)  ?Contraception  Rubella 3.50 (03/08 1040)  ?Circumcision  RPR Non Reactive (03/08 1040)   ?Pediatrician   HBsAg Negative (03/08 1040)   ?Support Person [name] HIV Non Reactive (03/08 1040)  ?Prenatal Classes  Varicella NON immune   ?  GBS  (For PCN allergy, check sensitivities)   ?BTL Consent     ?VBAC Consent  Pap  02/17/21, neg   ?  Hgb Electro    ?Pelvis Tested  CF   ?   SMA   ?     ? ? ?  ?  ? ?  ?  ? ?Preterm labor symptoms and general obstetric precautions including but not limited to vaginal bleeding, contractions, leaking  of fluid and fetal movement were reviewed in detail with the patient. ?Please refer to After Visit Summary for other counseling recommendations.  ? ?Return in about 4 weeks (around 06/05/2021) for ROB. ? ?Anatomy US later today  ? ?Carie Caddy, CNM  ?Domingo Pulse, [facility]  ?05/08/21  ?1:02 PM  ? ? ?

## 2021-05-09 LAB — PROTEIN / CREATININE RATIO, URINE
Creatinine, Urine: 102.5 mg/dL
Protein, Ur: 19.6 mg/dL
Protein/Creat Ratio: 191 mg/g creat (ref 0–200)

## 2021-05-14 LAB — SPECIMEN STATUS REPORT

## 2021-05-15 LAB — URINE DRUG PANEL 7

## 2021-05-30 ENCOUNTER — Telehealth: Payer: BC Managed Care – PPO | Admitting: Internal Medicine

## 2021-05-30 ENCOUNTER — Encounter: Payer: Self-pay | Admitting: Internal Medicine

## 2021-05-30 ENCOUNTER — Ambulatory Visit (INDEPENDENT_AMBULATORY_CARE_PROVIDER_SITE_OTHER): Payer: BC Managed Care – PPO | Admitting: Internal Medicine

## 2021-05-30 VITALS — BP 112/78 | HR 118 | Ht 60.0 in | Wt 176.0 lb

## 2021-05-30 DIAGNOSIS — N6001 Solitary cyst of right breast: Secondary | ICD-10-CM

## 2021-05-30 NOTE — Progress Notes (Signed)
Date:  05/30/2021   Name:  Kristina Kim   DOB:  Apr 17, 1990   MRN:  [id number]   Chief Complaint: Cyst  HPI Cyst - on right breast - has been there for several years; lanced several times in the past but now has recurred.  Over the past few days it is becoming more tender and draining a small amount of purulent material.  Lab Results  Component Value Date   NA 136 03/19/2021   K 3.6 03/19/2021   CO2 17 (L) 03/19/2021   GLUCOSE 70 03/19/2021   BUN 4 (L) 03/19/2021   CREATININE 0.50 (L) 03/19/2021   CALCIUM 9.4 03/19/2021   EGFR 130 03/19/2021   GFRNONAA >60 01/17/2019   No results found for: CHOL, HDL, LDLCALC, LDLDIRECT, TRIG, CHOLHDL No results found for: TSH No results found for: HGBA1C Lab Results  Component Value Date   WBC 13.4 (H) 03/19/2021   HGB 11.6 03/19/2021   HCT 34.7 03/19/2021   MCV 84 03/19/2021   PLT 361 03/19/2021   Lab Results  Component Value Date   ALT 20 03/19/2021   AST 15 03/19/2021   ALKPHOS 78 03/19/2021   BILITOT <0.2 03/19/2021   No results found for: 25OHVITD2, 25OHVITD3, VD25OH   Review of Systems  Constitutional:  Negative for chills, fatigue and fever.  Gastrointestinal:  Negative for diarrhea and vomiting.  Skin:        Cyst on breast - infected  Neurological:  Negative for dizziness and headaches.  Psychiatric/Behavioral:  Negative for sleep disturbance.    Patient Active Problem List   Diagnosis Date Noted   Hypertension affecting pregnancy 03/19/2021   Supervision of normal pregnancy 02/20/2021   Breast abscess 01/16/2019   Abnormal cervical Papanicolaou smear 11/18/2014   Cervical dysplasia 08/02/2013    No Known Allergies  Past Surgical History:  Procedure Laterality Date   COLPOSCOPY  2015   abnormal Pap [facility]   INCISION AND DRAINAGE ABSCESS Right 03/10/2019   Procedure: INCISION AND DRAINAGE ABSCESS;  Surgeon: Benjamine Sprague, DO;  Location: [facility] ORS;  Service: General;  Laterality: Right;   INCISION AND DRAINAGE  ABSCESS Right 10/04/2019   Procedure: INCISION AND DRAINAGE BREAST ABSCESS;  Surgeon: Benjamine Sprague, DO;  Location: [facility] ORS;  Service: General;  Laterality: Right;    Social History   Tobacco Use   Smoking status: Never   Smokeless tobacco: Never  Vaping Use   Vaping Use: Never used  Substance Use Topics   Alcohol use: Not Currently    Comment: occassionally   Drug use: Not Currently    Types: Marijuana     Medication list has been reviewed and updated.  Current Meds  Medication Sig   aspirin EC 81 MG tablet Take 81 mg by mouth daily. Swallow whole.   loratadine (CLARITIN) 10 MG tablet Take 10 mg by mouth daily.   Prenatal Vit-Fe Fumarate-FA (PRENATAL VITAMINS) 28-0.8 MG TABS Take 1 capsule by mouth daily.       05/30/2021    9:26 AM 01/31/2021    9:58 AM 09/25/2020    3:41 PM  GAD 7 : Generalized Anxiety Score  Nervous, Anxious, on Edge 0 1 1  Control/stop worrying 0 0 0  Worry too much - different things 1 0 0  Trouble relaxing 0 1 1  Restless 1 1 0  Easily annoyed or irritable 1 0 0  Afraid - awful might happen 0 0 1  Total GAD 7 Score 3 3 3  Anxiety Difficulty Not difficult at all Not difficult at all Not difficult at all       05/30/2021    9:26 AM  Depression screen PHQ 2/9  Decreased Interest 1  Down, Depressed, Hopeless 1  PHQ - 2 Score 2  Altered sleeping 2  Tired, decreased energy 1  Change in appetite 0  Feeling bad or failure about yourself  0  Trouble concentrating 1  Moving slowly or fidgety/restless 1  Suicidal thoughts 0  PHQ-9 Score 7  Difficult doing work/chores Not difficult at all    BP Readings from Last 3 Encounters:  05/30/21 112/78  05/08/21 126/82  04/29/21 126/80    Physical Exam Vitals and nursing note reviewed.  Constitutional:      General: She is not in acute distress.    Appearance: Normal appearance. She is well-developed.  HENT:     Head: Normocephalic and atraumatic.  Cardiovascular:     Rate and Rhythm: Normal  rate and regular rhythm.  Pulmonary:     Effort: Pulmonary effort is normal. No respiratory distress.     Breath sounds: No wheezing or rhonchi.  Chest:       Comments: 1 cm soft tender fluctuant lesion c/w infected cyst Skin:    General: Skin is warm and dry.     Findings: No rash.  Neurological:     Mental Status: She is alert and oriented to person, place, and time.  Psychiatric:        Mood and Affect: Mood normal.        Behavior: Behavior normal.    Wt Readings from Last 3 Encounters:  05/30/21 176 lb (79.8 kg)  05/08/21 174 lb (78.9 kg)  04/29/21 171 lb 8 oz (77.8 kg)    BP 112/78   Pulse (!) 118   Ht 5' (1.524 m)   Wt 176 lb (79.8 kg)   LMP 12/19/2020 (Exact Date)   SpO2 99%   BMI 34.37 kg/m   Assessment and Plan: 1. Breast cyst, right Needs I&D; will hold off on antibiotics since she is pregnant and they are not likely to resolve the issue. It has been lanced in the past but recurred. - Ambulatory referral to General Surgery   Partially dictated using Dragon software. Any errors are unintentional.  Halina Maidens, MD [facility]  05/30/2021

## 2021-06-02 ENCOUNTER — Encounter: Payer: Self-pay | Admitting: Surgery

## 2021-06-02 ENCOUNTER — Ambulatory Visit (INDEPENDENT_AMBULATORY_CARE_PROVIDER_SITE_OTHER): Payer: BC Managed Care – PPO | Admitting: Surgery

## 2021-06-02 ENCOUNTER — Other Ambulatory Visit: Payer: Self-pay

## 2021-06-02 VITALS — BP 92/63 | HR 101 | Temp 98.0°F | Ht 60.0 in | Wt 173.6 lb

## 2021-06-02 DIAGNOSIS — N6001 Solitary cyst of right breast: Secondary | ICD-10-CM

## 2021-06-02 MED ORDER — AMOXICILLIN-POT CLAVULANATE 875-125 MG PO TABS: 1.0000 | ORAL_TABLET | Freq: Two times a day (BID) | ORAL | 0 refills | Status: DC

## 2021-06-02 NOTE — Patient Instructions (Addendum)
Pick up your Antibiotic today.Please arrive at 8:45 am for your follow up appointment.  You may use warm compresses.Keep a dry dressing over the area to collect the drainage.

## 2021-06-04 NOTE — Progress Notes (Signed)
Surgical Consultation  06/04/2021  Kristina Kim is an 31 y.o. female.   Chief Complaint  Patient presents with   New Patient (Initial Visit)    Right breast cyst     HPI: Seen in consultation at the request of Dr. Ramiro Harvest lung for epidermal inclusion cyst in the right breast.  She reports she had cyst in the past.  In the last week or so reports pain on the right breast that is intermittent sharp and moderate intensity.  She is able to express some sebum from the right breast.  She did have prior history of breast abscesses that were performed in 2021 by Dr. Tonna Boehringer. He did have ultrasound of the breasts 2021 that I have personally reviewed showing some fluid on the right breast but without any suspicious cancers.  She is currently pregnant 23-1/2 weeks  Past Medical History:  Diagnosis Date   Abnormal Pap smear of cervix    Anxiety    Asthma    mild, no inhalers   Seasonal allergies    Supervision of normal first pregnancy, antepartum     Past Surgical History:  Procedure Laterality Date   COLPOSCOPY  2015   abnormal Pap [facility]   INCISION AND DRAINAGE ABSCESS Right 03/10/2019   Procedure: INCISION AND DRAINAGE ABSCESS;  Surgeon: Sung Amabile, DO;  Location: [facility] ORS;  Service: General;  Laterality: Right;   INCISION AND DRAINAGE ABSCESS Right 10/04/2019   Procedure: INCISION AND DRAINAGE BREAST ABSCESS;  Surgeon: Sung Amabile, DO;  Location: [facility] ORS;  Service: General;  Laterality: Right;    Family History  Problem Relation Age of Onset   Cancer Maternal Grandmother 64       vaginal   Breast cancer Maternal Grandfather    Breast cancer Paternal Grandmother     Social History:  reports that she has never smoked. She has never used smokeless tobacco. She reports that she does not currently use alcohol. She reports that she does not currently use drugs after having used the following drugs: Marijuana.  Allergies: No Known Allergies  Medications reviewed.     ROS Full ROS  performed and is otherwise negative other than what is stated in the HPI    BP 92/63   Pulse (!) 101   Temp 98 F (36.7 C) (Oral)   Ht 5' (1.524 m)   Wt 173 lb 9.6 oz (78.7 kg)   LMP 12/19/2020 (Exact Date)   SpO2 99%   BMI 33.90 kg/m   Physical Exam Vitals and nursing note reviewed. Exam conducted with a chaperone present.  Constitutional:      General: She is not in acute distress.    Appearance: Normal appearance. She is not ill-appearing.  Cardiovascular:     Rate and Rhythm: Normal rate and regular rhythm.     Heart sounds: No murmur heard.   No friction rub.  Pulmonary:     Effort: Pulmonary effort is normal. No respiratory distress.     Breath sounds: Normal breath sounds. No stridor.     Comments: BREAST: There is evidence of an infected epidermoid inclusion cyst on the right breast at 3:00.  There is some sebum that I can express.  It is tender to palpation.  There is no evidence of an obvious abscess or necrotizing infection Abdominal:     General: Abdomen is flat. There is no distension.     Palpations: Abdomen is soft. There is no mass.     Tenderness: There is no abdominal  tenderness. There is no guarding or rebound.     Hernia: No hernia is present.  Musculoskeletal:        General: No swelling or tenderness. Normal range of motion.     Cervical back: Normal range of motion and neck supple. No rigidity or tenderness.  Skin:    General: Skin is warm and dry.     Capillary Refill: Capillary refill takes less than 2 seconds.  Neurological:     General: No focal deficit present.     Mental Status: She is alert and oriented to person, place, and time.  Psychiatric:        Mood and Affect: Mood normal.        Behavior: Behavior normal.        Thought Content: Thought content normal.        Judgment: Judgment normal.    Assessment/Plan: 31 year old female with intrauterine pregnancy and a right epidermal inclusion cyst on the breast that seems to have some  infection.  Duration discussed with her in detail.  She is pregnant and she wishes to try antibiotics to control infection.  She understands that she may require excision of this lesion and that we may have to do it under general anesthetic since she has been unable to tolerate any local excisions in the office.  She also understands that the risk for preterm labor given that she is pregnant.  She wishes to try to avoid any surgical intervention during pregnancy and I agree with her.  If were able to get her through the pregnancy and at a later point perform excision of the cystic lesion in the OR will be in her best interest.  I will see her back in a few weeks.  No need for emergent surgical intervention at this time I have spent at least 45 minutes in this encounter including personally reviewing imaging studies, coordinating her care, placing orders and performing appropriate documentation A copy of this report was sent to the referring provider  Sterling Big, MD [facility] General Surgeon

## 2021-06-06 ENCOUNTER — Ambulatory Visit (INDEPENDENT_AMBULATORY_CARE_PROVIDER_SITE_OTHER): Payer: BC Managed Care – PPO | Admitting: Advanced Practice Midwife

## 2021-06-06 ENCOUNTER — Encounter: Payer: Self-pay | Admitting: Advanced Practice Midwife

## 2021-06-06 VITALS — BP 110/70 | Wt 180.0 lb

## 2021-06-06 DIAGNOSIS — Z3A24 24 weeks gestation of pregnancy: Secondary | ICD-10-CM

## 2021-06-06 DIAGNOSIS — Z3402 Encounter for supervision of normal first pregnancy, second trimester: Secondary | ICD-10-CM

## 2021-06-06 DIAGNOSIS — Z369 Encounter for antenatal screening, unspecified: Secondary | ICD-10-CM

## 2021-06-06 DIAGNOSIS — Z130 Encounter for screening for diseases of the blood and blood-forming organs and certain disorders involving the immune mechanism: Secondary | ICD-10-CM

## 2021-06-06 DIAGNOSIS — Z113 Encounter for screening for infections with a predominantly sexual mode of transmission: Secondary | ICD-10-CM

## 2021-06-06 DIAGNOSIS — Z131 Encounter for screening for diabetes mellitus: Secondary | ICD-10-CM

## 2021-06-06 LAB — POCT URINALYSIS DIPSTICK OB
Glucose, UA: NEGATIVE
POC,PROTEIN,UA: NEGATIVE

## 2021-06-06 NOTE — Addendum Note (Signed)
Addended by: Donnetta Hail on: 06/06/2021 10:17 AM   Modules accepted: Orders

## 2021-06-06 NOTE — Progress Notes (Signed)
ROB- no concerns 

## 2021-06-06 NOTE — Progress Notes (Signed)
Routine Prenatal Care Visit  Subjective  Kristina Kim is a 31 y.o. G1P0000 at [redacted]w[redacted]d being seen today for ongoing prenatal care.  She is currently monitored for the following issues for this low-risk pregnancy and has Abnormal cervical Papanicolaou smear; Breast abscess; Cervical dysplasia; Supervision of normal pregnancy; and Hypertension affecting pregnancy on their problem list.  ----------------------------------------------------------------------------------- Patient reports no complaints. She was seen recently for breast cyst that started draining. She is on antibiotics for same and reports no pain or signs of infection. Please see note from Dr Dahlia Byes.  Contractions: Not present. Vag. Bleeding: None.  Movement: Present. Leaking Fluid denies.  ----------------------------------------------------------------------------------- The following portions of the patient's history were reviewed and updated as appropriate: allergies, current medications, past family history, past medical history, past social history, past surgical history and problem list. Problem list updated.  Objective  Blood pressure 110/70, weight 180 lb (81.6 kg), last menstrual period 12/19/2020. Pregravid weight 160 lb (72.6 kg) Total Weight Gain 20 lb (9.072 kg) Urinalysis: Urine Protein    Urine Glucose    Fetal Status: Fetal Heart Rate (bpm): 143 Fundal Height: 25 cm Movement: Present     General:  Alert, oriented and cooperative. Patient is in no acute distress.  Skin: Skin is warm and dry. No rash noted.   Cardiovascular: Normal heart rate noted  Respiratory: Normal respiratory effort, no problems with respiration noted  Abdomen: Soft, gravid, appropriate for gestational age. Pain/Pressure: Absent     Pelvic:  Cervical exam deferred        Extremities: Normal range of motion.  Edema: None  Mental Status: Normal mood and affect. Normal behavior. Normal judgment and thought content.   Assessment   31 y.o. G1P0000 at  [redacted]w[redacted]d by  09/25/2021, by Last Menstrual Period presenting for routine prenatal visit  Plan   pregnancy Problems (from 02/17/21 to present)     Problem Noted Resolved   Hypertension affecting pregnancy 03/19/2021 by Imagene Riches, CNM No   Overview Signed 03/19/2021 10:13 AM by Imagene Riches, CNM    Noted at [place] of normal pregnancy 02/20/2021 by Allen Derry, CNM No   Overview Addendum 05/08/2021 10:04 AM by Allen Derry, [place] Staff Provider  Office Location  [facility] Dating  [redacted]w[redacted]d on 2/6   Language  English  Anatomy US  Normal anatomy. Does not know gender  Flu Vaccine   Genetic Screen  NIPS:   TDaP vaccine    Hgb A1C or  GTT Early : Third trimester :   Covid    LAB RESULTS   Rhogam  NA Blood Type B/Positive/-- (03/08 1040)   Feeding Plan Breast  Antibody Negative (03/08 1040)  Contraception  Rubella 3.50 (03/08 1040)  Circumcision  RPR Non Reactive (03/08 1040)   Pediatrician   HBsAg Negative (03/08 1040)   Support Person Merrilee Seashore HIV Non Reactive (03/08 1040)  Prenatal Classes  Varicella NON immune     GBS  (For PCN allergy, check sensitivities)   BTL Consent     VBAC Consent  Pap  02/17/21, neg     Hgb Electro    Pelvis Tested  CF      SMA                    Preterm labor symptoms and general obstetric precautions including but not limited to vaginal bleeding, contractions, leaking of fluid and fetal movement were reviewed in detail  with the patient. Please refer to After Visit Summary for other counseling recommendations.   Return in about 4 weeks (around 07/04/2021) for 28 wk labs and rob/please schedule 30 and 32 wk visits also.  Rod Can, CNM 06/06/2021 9:50 AM

## 2021-06-16 ENCOUNTER — Encounter: Payer: Self-pay | Admitting: Surgery

## 2021-06-16 ENCOUNTER — Ambulatory Visit (INDEPENDENT_AMBULATORY_CARE_PROVIDER_SITE_OTHER): Payer: BC Managed Care – PPO | Admitting: Surgery

## 2021-06-16 VITALS — BP 123/75 | HR 99 | Temp 98.7°F | Wt 178.8 lb

## 2021-06-16 DIAGNOSIS — N6001 Solitary cyst of right breast: Secondary | ICD-10-CM | POA: Diagnosis not present

## 2021-06-16 NOTE — Progress Notes (Signed)
Outpatient Surgical Follow Up  06/16/2021  Kristina Kim is an 31 y.o. female.   Chief Complaint  Patient presents with   Follow-up    Right breast abscess    HPI: Kristina Kim f/u infected EIC right breast. Doing well. No fevers or chills, has finished a/bs with good response. No drainage. IUP 25 wks, doing well  Past Medical History:  Diagnosis Date   Abnormal Pap smear of cervix    Anxiety    Asthma    mild, no inhalers   Seasonal allergies    Supervision of normal first pregnancy, antepartum     Past Surgical History:  Procedure Laterality Date   COLPOSCOPY  2015   abnormal Pap [facility]   INCISION AND DRAINAGE ABSCESS Right 03/10/2019   Procedure: INCISION AND DRAINAGE ABSCESS;  Surgeon: Sung Amabile, DO;  Location: [facility] ORS;  Service: General;  Laterality: Right;   INCISION AND DRAINAGE ABSCESS Right 10/04/2019   Procedure: INCISION AND DRAINAGE BREAST ABSCESS;  Surgeon: Sung Amabile, DO;  Location: [facility] ORS;  Service: General;  Laterality: Right;    Family History  Problem Relation Age of Onset   Cancer Maternal Grandmother 64       vaginal   Breast cancer Maternal Grandfather    Breast cancer Paternal Grandmother     Social History:  reports that she has never smoked. She has never used smokeless tobacco. She reports that she does not currently use alcohol. She reports that she does not currently use drugs after having used the following drugs: Marijuana.  Allergies: No Known Allergies  Medications reviewed.    ROS Full ROS performed and is otherwise negative other than what is stated in HPI   BP 123/75   Pulse 99   Temp 98.7 F (37.1 C) (Oral)   Wt 178 lb 12.8 oz (81.1 kg)   LMP 12/19/2020 (Exact Date)   SpO2 99%   BMI 34.92 kg/m   Physical Exam Vitals and nursing note reviewed. Exam conducted with a chaperone present.  Constitutional:      General: She is not in acute distress.    Appearance: Normal appearance. She is not ill-appearing.  BREAST: There is  evidence of an epidermoid inclusion cyst on the right breast at 3:00. No open wounds, resolved infection.   There is no evidence of an obvious abscess or necrotizing infection. No evdience of any masses or lesion on left breast Abdominal:     General: Abdomen IUP, There is no distension.     Palpations: Abdomen is soft. There is no mass.     Tenderness: There is no abdominal tenderness. There is no guarding or rebound.    Musculoskeletal:        General: No swelling or tenderness. Normal range of motion.     Cervical back: Normal range of motion and neck supple. No rigidity or tenderness.  Skin:    General: Skin is warm and dry.     Capillary Refill: Capillary refill takes less than 2 seconds.  Neurological:     General: No focal deficit present.     Mental Status: She is alert and oriented to person, place, and time.  Psychiatric:        Mood and Affect: Mood normal.        Behavior: Behavior normal.        Thought Content: Thought content normal.        Judgment: Judgment normal.   Assessment/Plan: Resolving EIC infection of Right breast. Responded well to  antibiotics. We will f/u 6 months and she will need to have excision EIC in the OR at some point in time to prevent further infections. D/W her about my thought process, she understands and agrees.   Greater than 50% of the 20 minutes  visit was spent in counseling/coordination of care   Sterling Big, MD [facility] General Surgeon

## 2021-06-16 NOTE — Patient Instructions (Signed)
If you have any concerns or questions, please feel free to call our office. Follow up in 6 months.   Mastitis  Mastitis is irritation and swelling (inflammation) in an area of the breast. This most often happens in women who are breastfeeding, but it can happen to other women too, as well as some men. A doctor will help decide if treatment is needed. What are the causes? This condition is caused by: Germs (bacteria). This can happen when germs enter the breast through cuts or openings in the skin. A plugged milk duct. This happens when something blocks the flow of milk in the breast. Nipple piercing. The pierced area can allow germs to enter the breast. Some types of breast cancer. What are the signs or symptoms? Swelling, redness, and pain in the breast. Swelling of the glands under the arm. Fluid flowing from the nipple. Feeling very tired. Headache and body aches. Fever and chills. Vomiting or feeling like you may vomit. Fast heart rate. Symptoms often last 2-5 days. The pain and redness can be the worst on days 2 and 3. This will often go away by day 5. If the infection is not treated, pus or a pocket of fluid may form under the skin (abscess). How is this diagnosed? This condition can usually be diagnosed based on a physical exam and your symptoms. You may also have other tests, such as: Blood tests to check if your body is fighting infection. X-rays or ultrasounds of the breast. Fluid tests. If a pocket of fluid has formed, the fluid may be taken out with a needle. The fluid may be checked to see whether germs are present. Breast milk testing. A sample of breast milk may be tested for germs. This is done only when breastfeeding or pumping. How is this treated? Mastitis will sometimes go away on its own, so your doctor may choose to wait 24 hours after first seeing you to decide if medicine is needed. This condition may be treated with: Continuing to breastfeed or pump from both  breasts to allow milk flow and prevent a pocket of fluid from forming. Using hot or cold compresses. Taking medicine for pain. Taking antibiotic medicine. Rest. Drinking plenty of fluids. Taking out fluid with a needle, if a pocket of fluid has formed. Follow these instructions at home: Breast care  Keep your nipples clean and dry. If told, put heat on the affected area of your breast. Use the heat source that your doctor or lactation specialist tells you to use. If told, put ice on the affected area of your breast. To do this: Put ice in a plastic bag. Place a towel between your skin and the bag. Leave the ice on for 20 minutes, 2-3 times a day. Take off the ice if your skin turns bright red. This is very important. If you cannot feel pain, heat, or cold, you have a greater risk of damage to the area. Breastfeeding and pumping tips Continue to breastfeed your baby on demand. This means feeding your baby whenever he or she is hungry. Ask your doctor or lactation specialist whether you need to change your breastfeeding routine. Avoid using nipple shields, if possible. Ask a lactation specialist for help. Change the breast you offer first at each feeding to make sure your baby feeds from both breasts. Offer both breasts to your baby every time your baby feeds. Use gentle breast massage during feeding or pumping sessions only as told by your doctor or lactation specialist.  Avoid letting your breasts get very full with milk (engorged). If your breasts are very full, you can hand express a small amount of milk for comfort. If you are pumping, keep pumping on the same schedule as you were before. In the breast with mastitis, pump until very little milk is coming out. Do not empty your breast. Emptying your breast causes your body to make more milk and can make symptoms worse. Ask your doctor or lactation specialist whether you need to change your pumping routine. Medicines Take over-the-counter  and prescription medicines only as told by your doctor. If you were prescribed an antibiotic medicine, take it as told by your doctor. Do not stop taking it even if you start to feel better. Contact a doctor if: You have pus-like fluid leaking from your breast. You have a fever. Your symptoms do not get better within 2 days of starting treatment. Get help right away if: Your pain and swelling are getting worse. Your pain is not helped by medicine. You have a red line going from your breast toward your armpit. Summary Mastitis is irritation and swelling in an area of the breast. Mastitis will sometimes go away on its own. Get plenty of rest. Contact a doctor if your symptoms do not get better within 2 days. If you were prescribed an antibiotic medicine, do not stop taking it even if you start to feel better. This information is not intended to replace advice given to you by your health care provider. Make sure you discuss any questions you have with your health care provider. Document Revised: 01/31/2021 Document Reviewed: 10/30/2019 Elsevier Patient Education  2023 [employer].

## 2021-07-01 ENCOUNTER — Telehealth: Payer: Self-pay

## 2021-07-01 NOTE — Telephone Encounter (Signed)
Pt returned call; adv brown just means old blood and nothing to worry about; has had IC in the last 24-48hrs; adv if has bright red bleeding like a period to be seen.  Pt reassured.

## 2021-07-01 NOTE — Telephone Encounter (Signed)
Pt calling; for the past 2 mornings when she goes to the bathroom she has a dark brown d/c; has GFM and she feels fine.  Is this normal?  [phone]  [facility]

## 2021-07-02 ENCOUNTER — Other Ambulatory Visit: Payer: Self-pay

## 2021-07-02 ENCOUNTER — Other Ambulatory Visit: Payer: BC Managed Care – PPO

## 2021-07-02 ENCOUNTER — Encounter: Payer: Self-pay | Admitting: Advanced Practice Midwife

## 2021-07-02 ENCOUNTER — Ambulatory Visit (INDEPENDENT_AMBULATORY_CARE_PROVIDER_SITE_OTHER): Payer: BC Managed Care – PPO | Admitting: Advanced Practice Midwife

## 2021-07-02 VITALS — BP 120/80 | Wt 179.0 lb

## 2021-07-02 DIAGNOSIS — Z3A27 27 weeks gestation of pregnancy: Secondary | ICD-10-CM

## 2021-07-02 DIAGNOSIS — Z113 Encounter for screening for infections with a predominantly sexual mode of transmission: Secondary | ICD-10-CM | POA: Diagnosis not present

## 2021-07-02 DIAGNOSIS — Z130 Encounter for screening for diseases of the blood and blood-forming organs and certain disorders involving the immune mechanism: Secondary | ICD-10-CM | POA: Diagnosis not present

## 2021-07-02 DIAGNOSIS — Z369 Encounter for antenatal screening, unspecified: Secondary | ICD-10-CM

## 2021-07-02 DIAGNOSIS — Z3402 Encounter for supervision of normal first pregnancy, second trimester: Secondary | ICD-10-CM

## 2021-07-02 DIAGNOSIS — Z131 Encounter for screening for diabetes mellitus: Secondary | ICD-10-CM

## 2021-07-02 LAB — POCT URINALYSIS DIPSTICK OB
Glucose, UA: NEGATIVE
POC,PROTEIN,UA: NEGATIVE

## 2021-07-02 NOTE — Patient Instructions (Signed)

## 2021-07-02 NOTE — Progress Notes (Signed)
Routine Prenatal Care Visit  Subjective  Kristina Kim is a 31 y.o. G1P0000 at [redacted]w[redacted]d being seen today for ongoing prenatal care.  She is currently monitored for the following issues for this low-risk pregnancy and has Abnormal cervical Papanicolaou smear; Breast abscess; Cervical dysplasia; Supervision of normal pregnancy; and Hypertension affecting pregnancy on their problem list.  ----------------------------------------------------------------------------------- Patient reports no complaints. Reviewed potential discomforts and precautions of third trimester. She is taking baby ASA Encouraged healthy pregnancy diet/PNV/Fe. One hour gtt today. Contractions: Not present. Vag. Bleeding: None.  Movement: Present. Leaking Fluid denies.  ----------------------------------------------------------------------------------- The following portions of the patient's history were reviewed and updated as appropriate: allergies, current medications, past family history, past medical history, past social history, past surgical history and problem list. Problem list updated.  Objective  Blood pressure 120/80, weight 179 lb (81.2 kg), last menstrual period 12/19/2020. Pregravid weight 160 lb (72.6 kg) Total Weight Gain 19 lb (8.618 kg) Urinalysis: Urine Protein    Urine Glucose    Fetal Status: Fetal Heart Rate (bpm): 142 Fundal Height: 29 cm Movement: Present     General:  Alert, oriented and cooperative. Patient is in no acute distress.  Skin: Skin is warm and dry. No rash noted.   Cardiovascular: Normal heart rate noted  Respiratory: Normal respiratory effort, no problems with respiration noted  Abdomen: Soft, gravid, appropriate for gestational age. Pain/Pressure: Absent     Pelvic:  Cervical exam deferred        Extremities: Normal range of motion.  Edema: None  Mental Status: Normal mood and affect. Normal behavior. Normal judgment and thought content.   Assessment   31 y.o. G1P0000 at [redacted]w[redacted]d by   09/25/2021, by Last Menstrual Period presenting for routine prenatal visit  Plan   pregnancy Problems (from 02/17/21 to present)    Problem Noted Resolved   Hypertension affecting pregnancy 03/19/2021 by [name], CNM No   Overview Signed 03/19/2021 10:13 AM by [name], CNM    Noted at [facility]      Supervision of normal pregnancy 02/20/2021 by Ellwood Sayers, CNM No   Overview Addendum 05/08/2021 10:04 AM by Ellwood Sayers, CNM     Nursing Staff Provider  Office Location  [facility] Dating  [redacted]w[redacted]d on 2/6   Language  English  Anatomy US  Normal anatomy. Does not know gender  Flu Vaccine   Genetic Screen  NIPS:   TDaP vaccine    Hgb A1C or  GTT Early : Third trimester :   Covid    LAB RESULTS   Rhogam  NA Blood Type B/Positive/-- (03/08 1040)   Feeding Plan Breast  Antibody Negative (03/08 1040)  Contraception  Rubella 3.50 (03/08 1040)  Circumcision  RPR Non Reactive (03/08 1040)   Pediatrician   HBsAg Negative (03/08 1040)   Support Person [name] HIV Non Reactive (03/08 1040)  Prenatal Classes  Varicella NON immune     GBS  (For PCN allergy, check sensitivities)   BTL Consent     VBAC Consent  Pap  02/17/21, neg     Hgb Electro    Pelvis Tested  CF      SMA                   Preterm labor symptoms and general obstetric precautions including but not limited to vaginal bleeding, contractions, leaking of fluid and fetal movement were reviewed in detail with the patient. Please refer to After Visit Summary for other counseling  recommendations.   Return in about 2 weeks (around 07/16/2021) for rob.  Tresea Mall, CNM 07/02/2021 8:37 AM

## 2021-07-03 LAB — 28 WEEK RH+PANEL
Basophils Absolute: 0 10*3/uL (ref 0.0–0.2)
Basos: 0 %
EOS (ABSOLUTE): 0.1 10*3/uL (ref 0.0–0.4)
Eos: 1 %
Gestational Diabetes Screen: 115 mg/dL (ref 70–139)
HIV Screen 4th Generation wRfx: NONREACTIVE
Hematocrit: 37.1 % (ref 34.0–46.6)
Hemoglobin: 12.1 g/dL (ref 11.1–15.9)
Immature Grans (Abs): 0.2 10*3/uL — ABNORMAL HIGH (ref 0.0–0.1)
Immature Granulocytes: 2 %
Lymphocytes Absolute: 1.3 10*3/uL (ref 0.7–3.1)
Lymphs: 13 %
MCH: 27.6 pg (ref 26.6–33.0)
MCHC: 32.6 g/dL (ref 31.5–35.7)
MCV: 85 fL (ref 79–97)
Monocytes Absolute: 0.5 10*3/uL (ref 0.1–0.9)
Monocytes: 5 %
Neutrophils Absolute: 8 10*3/uL — ABNORMAL HIGH (ref 1.4–7.0)
Neutrophils: 79 %
Platelets: 358 10*3/uL (ref 150–450)
RBC: 4.38 x10E6/uL (ref 3.77–5.28)
RDW: 14.3 % (ref 11.7–15.4)
RPR Ser Ql: NONREACTIVE
WBC: 10.1 10*3/uL (ref 3.4–10.8)

## 2021-07-04 ENCOUNTER — Other Ambulatory Visit: Payer: BC Managed Care – PPO

## 2021-07-04 ENCOUNTER — Encounter: Payer: BC Managed Care – PPO | Admitting: Obstetrics

## 2021-07-18 ENCOUNTER — Encounter: Payer: Self-pay | Admitting: Advanced Practice Midwife

## 2021-07-18 ENCOUNTER — Ambulatory Visit (INDEPENDENT_AMBULATORY_CARE_PROVIDER_SITE_OTHER): Payer: BC Managed Care – PPO | Admitting: Advanced Practice Midwife

## 2021-07-18 VITALS — BP 126/80 | Wt 181.0 lb

## 2021-07-18 DIAGNOSIS — Z3403 Encounter for supervision of normal first pregnancy, third trimester: Secondary | ICD-10-CM

## 2021-07-18 DIAGNOSIS — Z3A30 30 weeks gestation of pregnancy: Secondary | ICD-10-CM

## 2021-07-18 DIAGNOSIS — Z23 Encounter for immunization: Secondary | ICD-10-CM | POA: Diagnosis not present

## 2021-07-18 LAB — POCT URINALYSIS DIPSTICK OB: Glucose, UA: NEGATIVE

## 2021-07-18 NOTE — Progress Notes (Signed)
Routine Prenatal Care Visit  Subjective  Kristina Kim is a 31 y.o. G1P0000 at [redacted]w[redacted]d being seen today for ongoing prenatal care.  She is currently monitored for the following issues for this low-risk pregnancy and has Abnormal cervical Papanicolaou smear; Breast abscess; Cervical dysplasia; Supervision of normal pregnancy; and Hypertension affecting pregnancy on their problem list.  ----------------------------------------------------------------------------------- Patient reports no complaints.   Contractions: Not present. Vag. Bleeding: None.  Movement: Present. Leaking Fluid denies.  ----------------------------------------------------------------------------------- The following portions of the patient's history were reviewed and updated as appropriate: allergies, current medications, past family history, past medical history, past social history, past surgical history and problem list. Problem list updated.  Objective  Blood pressure 126/80, weight 181 lb (82.1 kg), last menstrual period 12/19/2020. Pregravid weight 160 lb (72.6 kg) Total Weight Gain 21 lb (9.526 kg) Urinalysis: Urine Protein    Urine Glucose    Fetal Status: Fetal Heart Rate (bpm): 128 Fundal Height: 31 cm Movement: Present     General:  Alert, oriented and cooperative. Patient is in no acute distress.  Skin: Skin is warm and dry. No rash noted.   Cardiovascular: Normal heart rate noted  Respiratory: Normal respiratory effort, no problems with respiration noted  Abdomen: Soft, gravid, appropriate for gestational age. Pain/Pressure: Absent     Pelvic:  Cervical exam deferred        Extremities: Normal range of motion.  Edema: None  Mental Status: Normal mood and affect. Normal behavior. Normal judgment and thought content.   Assessment   31 y.o. G1P0000 at 107w1d by  09/25/2021, by Last Menstrual Period presenting for routine prenatal visit  Plan   pregnancy Problems (from 02/17/21 to present)    Problem Noted  Resolved   Hypertension affecting pregnancy 03/19/2021 by [name], CNM No   Overview Signed 03/19/2021 10:13 AM by [name], CNM    Noted at [facility]      Supervision of normal pregnancy 02/20/2021 by Ellwood Sayers, CNM No   Overview Addendum 05/08/2021 10:04 AM by Ellwood Sayers, CNM     Nursing Staff Provider  Office Location  [facility] Dating  [redacted]w[redacted]d on 2/6   Language  English  Anatomy US  Normal anatomy. Does not know gender  Flu Vaccine   Genetic Screen  NIPS:   TDaP vaccine   07/18/21 Hgb A1C or  GTT Early : Third trimester :   Covid    LAB RESULTS   Rhogam  NA Blood Type B/Positive/-- (03/08 1040)   Feeding Plan Breast  Antibody Negative (03/08 1040)  Contraception  Rubella 3.50 (03/08 1040)  Circumcision  RPR Non Reactive (03/08 1040)   Pediatrician   HBsAg Negative (03/08 1040)   Support Person [name] HIV Non Reactive (03/08 1040)  Prenatal Classes  Varicella NON immune     GBS  (For PCN allergy, check sensitivities)   BTL Consent     VBAC Consent  Pap  02/17/21, neg     Hgb Electro    Pelvis Tested  CF      SMA                   Preterm labor symptoms and general obstetric precautions including but not limited to vaginal bleeding, contractions, leaking of fluid and fetal movement were reviewed in detail with the patient. Please refer to After Visit Summary for other counseling recommendations.   Return in about 2 weeks (around 08/01/2021) for rob.  Tresea Mall, CNM 07/18/2021 9:03 AM

## 2021-07-18 NOTE — Addendum Note (Signed)
Addended by: Cornelius Moras D on: 07/18/2021 09:15 AM   Modules accepted: Orders

## 2021-07-21 ENCOUNTER — Encounter: Payer: Self-pay | Admitting: Advanced Practice Midwife

## 2021-08-01 ENCOUNTER — Encounter: Payer: Self-pay | Admitting: Advanced Practice Midwife

## 2021-08-01 ENCOUNTER — Ambulatory Visit (INDEPENDENT_AMBULATORY_CARE_PROVIDER_SITE_OTHER): Payer: BC Managed Care – PPO | Admitting: Advanced Practice Midwife

## 2021-08-01 VITALS — BP 120/80 | Wt 183.0 lb

## 2021-08-01 DIAGNOSIS — F1291 Cannabis use, unspecified, in remission: Secondary | ICD-10-CM | POA: Diagnosis not present

## 2021-08-01 DIAGNOSIS — Z3A32 32 weeks gestation of pregnancy: Secondary | ICD-10-CM

## 2021-08-01 DIAGNOSIS — Z369 Encounter for antenatal screening, unspecified: Secondary | ICD-10-CM | POA: Diagnosis not present

## 2021-08-01 DIAGNOSIS — Z3403 Encounter for supervision of normal first pregnancy, third trimester: Secondary | ICD-10-CM | POA: Diagnosis not present

## 2021-08-01 NOTE — Progress Notes (Signed)
Routine Prenatal Care Visit  Subjective  Kristina Kim is a 31 y.o. G1P0000 at [redacted]w[redacted]d being seen today for ongoing prenatal care.  She is currently monitored for the following issues for this low-risk pregnancy and has Abnormal cervical Papanicolaou smear; Breast abscess; Cervical dysplasia; Supervision of normal pregnancy; and Hypertension affecting pregnancy on their problem list.  ----------------------------------------------------------------------------------- Patient reports no complaints.   Contractions: Not present. Vag. Bleeding: None.  Movement: Present. Leaking Fluid denies.  ----------------------------------------------------------------------------------- The following portions of the patient's history were reviewed and updated as appropriate: allergies, current medications, past family history, past medical history, past social history, past surgical history and problem list. Problem list updated.  Objective  Blood pressure 120/80, weight 183 lb (83 kg), last menstrual period 12/19/2020. Pregravid weight 160 lb (72.6 kg) Total Weight Gain 23 lb (10.4 kg) Urinalysis: Urine Protein    Urine Glucose    Fetal Status: Fetal Heart Rate (bpm): 145 Fundal Height: 33 cm Movement: Present     General:  Alert, oriented and cooperative. Patient is in no acute distress.  Skin: Skin is warm and dry. No rash noted.   Cardiovascular: Normal heart rate noted  Respiratory: Normal respiratory effort, no problems with respiration noted  Abdomen: Soft, gravid, appropriate for gestational age. Pain/Pressure: Absent     Pelvic:  Cervical exam deferred        Extremities: Normal range of motion.     Mental Status: Normal mood and affect. Normal behavior. Normal judgment and thought content.   Assessment   31 y.o. G1P0000 at [redacted]w[redacted]d by  09/25/2021, by Last Menstrual Period presenting for routine prenatal visit  Plan   pregnancy Problems (from 02/17/21 to present)    Problem Noted Resolved    Hypertension affecting pregnancy 03/19/2021 by [name], CNM No   Overview Signed 03/19/2021 10:13 AM by [name], CNM    Noted at [facility]      Supervision of normal pregnancy 02/20/2021 by Ellwood Sayers, CNM No   Overview Addendum 07/18/2021  9:04 AM by Tresea Mall, CNM     Nursing Staff Provider  Office Location  [facility] Dating  [redacted]w[redacted]d on 2/6   Language  English  Anatomy US  Normal anatomy. Does not know gender  Flu Vaccine   Genetic Screen  NIPS:   TDaP vaccine   07/18/21 Hgb A1C or  GTT Early : Third trimester :   Covid    LAB RESULTS   Rhogam  NA Blood Type B/Positive/-- (03/08 1040)   Feeding Plan Breast  Antibody Negative (03/08 1040)  Contraception  Rubella 3.50 (03/08 1040)  Circumcision  RPR Non Reactive (03/08 1040)   Pediatrician   HBsAg Negative (03/08 1040)   Support Person [name] HIV Non Reactive (03/08 1040)  Prenatal Classes  Varicella NON immune     GBS  (For PCN allergy, check sensitivities)   BTL Consent     VBAC Consent  Pap  02/17/21, neg     Hgb Electro    Pelvis Tested  CF      SMA                   Preterm labor symptoms and general obstetric precautions including but not limited to vaginal bleeding, contractions, leaking of fluid and fetal movement were reviewed in detail with the patient. Please refer to After Visit Summary for other counseling recommendations.   Return in about 2 weeks (around 08/15/2021) for rob.  Tresea Mall, CNM 08/01/2021 9:16 AM

## 2021-08-02 LAB — URINE DRUG PANEL 7
Amphetamines, Urine: NEGATIVE ng/mL
Barbiturate Quant, Ur: NEGATIVE ng/mL
Benzodiazepine Quant, Ur: NEGATIVE ng/mL
Cannabinoid Quant, Ur: NEGATIVE ng/mL
Cocaine (Metab.): NEGATIVE ng/mL
Opiate Quant, Ur: NEGATIVE ng/mL
PCP Quant, Ur: NEGATIVE ng/mL

## 2021-08-05 ENCOUNTER — Encounter: Payer: Self-pay | Admitting: Advanced Practice Midwife

## 2021-08-19 ENCOUNTER — Ambulatory Visit (INDEPENDENT_AMBULATORY_CARE_PROVIDER_SITE_OTHER): Payer: BC Managed Care – PPO | Admitting: Obstetrics & Gynecology

## 2021-08-19 VITALS — BP 120/80 | Wt 187.0 lb

## 2021-08-19 DIAGNOSIS — Z3403 Encounter for supervision of normal first pregnancy, third trimester: Secondary | ICD-10-CM

## 2021-08-19 DIAGNOSIS — O163 Unspecified maternal hypertension, third trimester: Secondary | ICD-10-CM

## 2021-08-19 DIAGNOSIS — Z3A34 34 weeks gestation of pregnancy: Secondary | ICD-10-CM

## 2021-08-19 LAB — POCT URINALYSIS DIPSTICK OB: Glucose, UA: NEGATIVE

## 2021-08-19 NOTE — Addendum Note (Signed)
Addended by: Cornelius Moras D on: 08/19/2021 08:38 AM   Modules accepted: Orders

## 2021-08-19 NOTE — Progress Notes (Signed)
   PRENATAL VISIT NOTE  Subjective:  Kristina Kim is a 31 y.o. married G1P0000 at [redacted]w[redacted]d being seen today for ongoing prenatal care.  She is currently monitored for the following issues for this high-risk pregnancy and has Abnormal cervical Papanicolaou smear; Breast abscess; Cervical dysplasia; Supervision of normal pregnancy; and Hypertension affecting pregnancy on their problem list.  Patient reports no complaints.  Contractions: Not present. Vag. Bleeding: None.  Movement: Present. Denies leaking of fluid.   The following portions of the patient's history were reviewed and updated as appropriate: allergies, current medications, past family history, past medical history, past social history, past surgical history and problem list.   Objective:   Vitals:   08/19/21 0811  BP: 120/80  Weight: 187 lb (84.8 kg)  FH$ -130s  Fetal Status:     Movement: Present     General:  Alert, oriented and cooperative. Patient is in no acute distress.  Skin: Skin is warm and dry. No rash noted.   Cardiovascular: Normal heart rate noted  Respiratory: Normal respiratory effort, no problems with respiration noted  Abdomen: Soft, gravid, appropriate for gestational age.  Pain/Pressure: Absent     Pelvic: deferred        Extremities: Normal range of motion.     Mental Status: Normal mood and affect. Normal behavior. Normal judgment and thought content.   Assessment and Plan:  Pregnancy: G1P0000 at [redacted]w[redacted]d 1. Encounter for supervision of normal first pregnancy in third trimester   2. [redacted] weeks gestation of pregnancy   3. Hypertension affecting pregnancy in third trimester - continue baby asa daily  Preterm labor symptoms and general obstetric precautions including but not limited to vaginal bleeding, contractions, leaking of fluid and fetal movement were reviewed in detail with the patient. Please refer to After Visit Summary for other counseling recommendations.   No follow-ups on file.  No future  appointments.  Allie Bossier, MD

## 2021-08-28 ENCOUNTER — Other Ambulatory Visit (HOSPITAL_COMMUNITY)
Admission: RE | Admit: 2021-08-28 | Discharge: 2021-08-28 | Disposition: A | Discharge status: Home / Self Care | Payer: BC Managed Care – PPO | Source: Ambulatory Visit | Attending: Obstetrics | Admitting: Obstetrics

## 2021-08-28 ENCOUNTER — Ambulatory Visit (INDEPENDENT_AMBULATORY_CARE_PROVIDER_SITE_OTHER): Payer: BC Managed Care – PPO | Admitting: Obstetrics

## 2021-08-28 VITALS — BP 124/80 | Wt 193.0 lb

## 2021-08-28 DIAGNOSIS — Z3493 Encounter for supervision of normal pregnancy, unspecified, third trimester: Secondary | ICD-10-CM | POA: Insufficient documentation

## 2021-08-28 DIAGNOSIS — Z3A36 36 weeks gestation of pregnancy: Secondary | ICD-10-CM | POA: Diagnosis not present

## 2021-08-28 DIAGNOSIS — Z113 Encounter for screening for infections with a predominantly sexual mode of transmission: Secondary | ICD-10-CM

## 2021-08-28 DIAGNOSIS — Z3403 Encounter for supervision of normal first pregnancy, third trimester: Secondary | ICD-10-CM

## 2021-08-28 NOTE — Progress Notes (Signed)
Routine Prenatal Care Visit  Subjective  Kristina Kim is a 31 y.o. G1P0000 at [redacted]w[redacted]d being seen today for ongoing prenatal care.  She is currently monitored for the following issues for this high-risk pregnancy and has Abnormal cervical Papanicolaou smear; Breast abscess; Cervical dysplasia; Supervision of normal pregnancy; and Hypertension affecting pregnancy on their problem list.  ----------------------------------------------------------------------------------- Patient reports no complaints.   Contractions: Not present. Vag. Bleeding: None.  Movement: Present. Leaking Fluid denies.  ----------------------------------------------------------------------------------- The following portions of the patient's history were reviewed and updated as appropriate: allergies, current medications, past family history, past medical history, past social history, past surgical history and problem list. Problem list updated.  Objective  Blood pressure 124/80, weight 193 lb (87.5 kg), last menstrual period 12/19/2020. Pregravid weight 160 lb (72.6 kg) Total Weight Gain 33 lb (15 kg) Urinalysis: Urine Protein    Urine Glucose    Fetal Status:     Movement: Present     General:  Alert, oriented and cooperative. Patient is in no acute distress.  Skin: Skin is warm and dry. No rash noted.   Cardiovascular: Normal heart rate noted  Respiratory: Normal respiratory effort, no problems with respiration noted  Abdomen: Soft, gravid, appropriate for gestational age. Pain/Pressure: Absent     Pelvic:  Cervical exam deferred        Extremities: Normal range of motion.     Mental Status: Normal mood and affect. Normal behavior. Normal judgment and thought content.   Assessment   31 y.o. G1P0000 at [redacted]w[redacted]d by  09/25/2021, by Last Menstrual Period presenting for routine prenatal visit  Plan   pregnancy Problems (from 02/17/21 to present)    Problem Noted Resolved   Hypertension affecting pregnancy 03/19/2021 by  [name], CNM No   Overview Signed 03/19/2021 10:13 AM by [name], CNM    Noted at [facility]      Supervision of normal pregnancy 02/20/2021 by Ellwood Sayers, CNM No   Overview Addendum 07/18/2021  9:04 AM by Tresea Mall, CNM     Nursing Staff Provider  Office Location  [facility] Dating  [redacted]w[redacted]d on 2/6   Language  English  Anatomy US  Normal anatomy. Does not know gender  Flu Vaccine   Genetic Screen  NIPS:   TDaP vaccine   07/18/21 Hgb A1C or  GTT Early : Third trimester :   Covid    LAB RESULTS   Rhogam  NA Blood Type B/Positive/-- (03/08 1040)   Feeding Plan Breast  Antibody Negative (03/08 1040)  Contraception  Rubella 3.50 (03/08 1040)  Circumcision  RPR Non Reactive (03/08 1040)   Pediatrician   HBsAg Negative (03/08 1040)   Support Person [name] HIV Non Reactive (03/08 1040)  Prenatal Classes  Varicella NON immune     GBS  (For PCN allergy, check sensitivities)   BTL Consent     VBAC Consent  Pap  02/17/21, neg     Hgb Electro    Pelvis Tested  CF      SMA                   Preterm labor symptoms and general obstetric precautions including but not limited to vaginal bleeding, contractions, leaking of fluid and fetal movement were reviewed in detail with the patient. Please refer to After Visit Summary for other counseling recommendations. GC/CZ culture, and GBS done today. She was not planning on contracepting post delivery. We discussed closely spaced pregnancies and the need for  time between pregnancies- She may opt for a the Nexplanon. Weekly visits now. She denies any headaches, visual changes or new edema.  Return in about 1 week (around 09/04/2021) for return OB.  [name], CNM  08/28/2021 8:38 AM

## 2021-08-28 NOTE — Progress Notes (Signed)
GBS today

## 2021-08-29 LAB — CERVICOVAGINAL ANCILLARY ONLY
Bacterial Vaginitis (gardnerella): NEGATIVE
Candida Glabrata: NEGATIVE
Candida Vaginitis: NEGATIVE
Chlamydia: NEGATIVE
Comment: NEGATIVE
Comment: NEGATIVE
Comment: NEGATIVE
Comment: NEGATIVE
Comment: NEGATIVE
Comment: NORMAL
Neisseria Gonorrhea: NEGATIVE
Trichomonas: NEGATIVE

## 2021-09-01 LAB — CULTURE, BETA STREP (GROUP B ONLY): Strep Gp B Culture: NEGATIVE

## 2021-09-08 ENCOUNTER — Inpatient Hospital Stay
Admission: RE | Admit: 2021-09-08 | Discharge: 2021-09-13 | DRG: 787 | Disposition: A | Discharge status: Home / Self Care | Payer: BC Managed Care – PPO | Source: Ambulatory Visit | Attending: Advanced Practice Midwife | Admitting: Advanced Practice Midwife

## 2021-09-08 ENCOUNTER — Other Ambulatory Visit: Payer: Self-pay

## 2021-09-08 ENCOUNTER — Ambulatory Visit (INDEPENDENT_AMBULATORY_CARE_PROVIDER_SITE_OTHER): Payer: BC Managed Care – PPO | Admitting: Advanced Practice Midwife

## 2021-09-08 ENCOUNTER — Encounter: Payer: Self-pay | Admitting: Advanced Practice Midwife

## 2021-09-08 ENCOUNTER — Encounter: Payer: Self-pay | Admitting: Obstetrics and Gynecology

## 2021-09-08 VITALS — BP 140/82 | Wt 193.0 lb

## 2021-09-08 DIAGNOSIS — D62 Acute posthemorrhagic anemia: Secondary | ICD-10-CM | POA: Diagnosis not present

## 2021-09-08 DIAGNOSIS — O9081 Anemia of the puerperium: Secondary | ICD-10-CM | POA: Diagnosis not present

## 2021-09-08 DIAGNOSIS — Z3A37 37 weeks gestation of pregnancy: Secondary | ICD-10-CM

## 2021-09-08 DIAGNOSIS — O1414 Severe pre-eclampsia complicating childbirth: Principal | ICD-10-CM | POA: Diagnosis present

## 2021-09-08 DIAGNOSIS — O1493 Unspecified pre-eclampsia, third trimester: Secondary | ICD-10-CM | POA: Diagnosis present

## 2021-09-08 DIAGNOSIS — O163 Unspecified maternal hypertension, third trimester: Secondary | ICD-10-CM | POA: Diagnosis present

## 2021-09-08 DIAGNOSIS — Z3490 Encounter for supervision of normal pregnancy, unspecified, unspecified trimester: Secondary | ICD-10-CM

## 2021-09-08 DIAGNOSIS — Z3403 Encounter for supervision of normal first pregnancy, third trimester: Secondary | ICD-10-CM

## 2021-09-08 DIAGNOSIS — O164 Unspecified maternal hypertension, complicating childbirth: Secondary | ICD-10-CM | POA: Diagnosis not present

## 2021-09-08 DIAGNOSIS — O1494 Unspecified pre-eclampsia, complicating childbirth: Secondary | ICD-10-CM | POA: Diagnosis not present

## 2021-09-08 HISTORY — DX: Unspecified pre-eclampsia, third trimester: O14.93

## 2021-09-08 LAB — CBC WITH DIFFERENTIAL/PLATELET
Abs Immature Granulocytes: 0.13 10*3/uL — ABNORMAL HIGH (ref 0.00–0.07)
Basophils Absolute: 0 10*3/uL (ref 0.0–0.1)
Basophils Relative: 1 %
Eosinophils Absolute: 0.1 10*3/uL (ref 0.0–0.5)
Eosinophils Relative: 1 %
HCT: 40.5 % (ref 36.0–46.0)
Hemoglobin: 12.9 g/dL (ref 12.0–15.0)
Immature Granulocytes: 2 %
Lymphocytes Relative: 16 %
Lymphs Abs: 1.3 10*3/uL (ref 0.7–4.0)
MCH: 26.7 pg (ref 26.0–34.0)
MCHC: 31.9 g/dL (ref 30.0–36.0)
MCV: 83.7 fL (ref 80.0–100.0)
Monocytes Absolute: 0.6 10*3/uL (ref 0.1–1.0)
Monocytes Relative: 7 %
Neutro Abs: 6.2 10*3/uL (ref 1.7–7.7)
Neutrophils Relative %: 73 %
Platelets: 239 10*3/uL (ref 150–400)
RBC: 4.84 MIL/uL (ref 3.87–5.11)
RDW: 14.9 % (ref 11.5–15.5)
Smear Review: NORMAL
WBC: 8.4 10*3/uL (ref 4.0–10.5)
nRBC: 0.2 % (ref 0.0–0.2)

## 2021-09-08 LAB — COMPREHENSIVE METABOLIC PANEL
ALT: 23 U/L (ref 0–44)
AST: 23 U/L (ref 15–41)
Albumin: 3.2 g/dL — ABNORMAL LOW (ref 3.5–5.0)
Alkaline Phosphatase: 195 U/L — ABNORMAL HIGH (ref 38–126)
Anion gap: 10 (ref 5–15)
BUN: 6 mg/dL (ref 6–20)
CO2: 19 mmol/L — ABNORMAL LOW (ref 22–32)
Calcium: 9 mg/dL (ref 8.9–10.3)
Chloride: 107 mmol/L (ref 98–111)
Creatinine, Ser: 0.46 mg/dL (ref 0.44–1.00)
GFR, Estimated: >60 mL/min (ref >60)
Glucose, Bld: 95 mg/dL (ref 70–99)
Potassium: 3.5 mmol/L (ref 3.5–5.1)
Sodium: 136 mmol/L (ref 135–145)
Total Bilirubin: 0.4 mg/dL (ref 0.3–1.2)
Total Protein: 6.8 g/dL (ref 6.5–8.1)

## 2021-09-08 LAB — URINE DRUG SCREEN, QUALITATIVE (ARMC ONLY)
Amphetamines, Ur Screen: NOT DETECTED
Barbiturates, Ur Screen: NOT DETECTED
Benzodiazepine, Ur Scrn: NOT DETECTED
Cannabinoid 50 Ng, Ur ~~LOC~~: NOT DETECTED
Cocaine Metabolite,Ur ~~LOC~~: NOT DETECTED
MDMA (Ecstasy)Ur Screen: NOT DETECTED
Methadone Scn, Ur: NOT DETECTED
Opiate, Ur Screen: NOT DETECTED
Phencyclidine (PCP) Ur S: NOT DETECTED
Tricyclic, Ur Screen: NOT DETECTED

## 2021-09-08 LAB — PROTEIN / CREATININE RATIO, URINE
Creatinine, Urine: 169 mg/dL
Protein Creatinine Ratio: 1.54 mg/mg{Cre} — ABNORMAL HIGH (ref 0.00–0.15)
Total Protein, Urine: 260 mg/dL

## 2021-09-08 LAB — POCT URINALYSIS DIPSTICK OB: Glucose, UA: NEGATIVE

## 2021-09-08 LAB — TYPE AND SCREEN
ABO/RH(D): B POS
Antibody Screen: NEGATIVE

## 2021-09-08 LAB — ABO/RH: ABO/RH(D): B POS

## 2021-09-08 MED ORDER — MISOPROSTOL 50MCG HALF TABLET
50.0000 ug | ORAL_TABLET | ORAL | Status: DC
  Administered 2021-09-08: 50 ug via ORAL
  Filled 2021-09-08: qty 1

## 2021-09-08 MED ORDER — MISOPROSTOL 50MCG HALF TABLET
ORAL_TABLET | ORAL | Status: AC
  Filled 2021-09-08: qty 2

## 2021-09-08 MED ORDER — MISOPROSTOL 50MCG HALF TABLET
50.0000 ug | ORAL_TABLET | ORAL | Status: DC
Start: 2021-09-08 — End: 2021-09-09
  Administered 2021-09-08: 50 ug via VAGINAL

## 2021-09-08 MED ORDER — LIDOCAINE HCL (PF) 1 % IJ SOLN: 30.0000 mL | INTRAMUSCULAR | Status: DC | PRN

## 2021-09-08 MED ORDER — MISOPROSTOL 25 MCG QUARTER TABLET
25.0000 ug | ORAL_TABLET | Freq: Once | ORAL | Status: AC
  Administered 2021-09-08: 25 ug via VAGINAL
  Filled 2021-09-08: qty 1

## 2021-09-08 MED ORDER — OXYTOCIN 10 UNIT/ML IJ SOLN
INTRAMUSCULAR | Status: AC
  Filled 2021-09-08: qty 2

## 2021-09-08 MED ORDER — LABETALOL HCL 5 MG/ML IV SOLN
40.0000 mg | INTRAVENOUS | Status: DC | PRN
Start: 2021-09-08 — End: 2021-09-08
  Administered 2021-09-08 (×2): 40 mg via INTRAVENOUS
  Filled 2021-09-08 (×2): qty 8

## 2021-09-08 MED ORDER — LABETALOL HCL 5 MG/ML IV SOLN: 80.0000 mg | INTRAVENOUS | Status: DC | PRN

## 2021-09-08 MED ORDER — MAGNESIUM SULFATE BOLUS VIA INFUSION
4.0000 g | Freq: Once | INTRAVENOUS | Status: AC
Start: 2021-09-08 — End: 2021-09-08
  Administered 2021-09-08: 4 g via INTRAVENOUS
  Filled 2021-09-08: qty 1000

## 2021-09-08 MED ORDER — MISOPROSTOL 200 MCG PO TABS
ORAL_TABLET | ORAL | Status: AC
  Filled 2021-09-08: qty 4

## 2021-09-08 MED ORDER — OXYTOCIN BOLUS FROM INFUSION: 333.0000 mL | Freq: Once | INTRAVENOUS | Status: DC

## 2021-09-08 MED ORDER — OXYCODONE-ACETAMINOPHEN 5-325 MG PO TABS: 2.0000 | ORAL_TABLET | ORAL | Status: DC | PRN

## 2021-09-08 MED ORDER — OXYCODONE-ACETAMINOPHEN 5-325 MG PO TABS: 1.0000 | ORAL_TABLET | ORAL | Status: DC | PRN

## 2021-09-08 MED ORDER — OXYTOCIN-SODIUM CHLORIDE 30-0.9 UT/500ML-% IV SOLN
2.5000 [IU]/h | INTRAVENOUS | Status: DC
Start: 2021-09-08 — End: 2021-09-10
  Administered 2021-09-10: 2.5 [IU]/h via INTRAVENOUS
  Filled 2021-09-08 (×2): qty 500

## 2021-09-08 MED ORDER — ACETAMINOPHEN 325 MG PO TABS
650.0000 mg | ORAL_TABLET | ORAL | Status: DC | PRN
Start: 2021-09-08 — End: 2021-09-10
  Filled 2021-09-08 (×2): qty 2

## 2021-09-08 MED ORDER — LABETALOL HCL 5 MG/ML IV SOLN
20.0000 mg | INTRAVENOUS | Status: DC | PRN
  Administered 2021-09-08 – 2021-09-10 (×4): 20 mg via INTRAVENOUS
  Filled 2021-09-08 (×3): qty 4

## 2021-09-08 MED ORDER — TERBUTALINE SULFATE 1 MG/ML IJ SOLN
0.2500 mg | Freq: Once | INTRAMUSCULAR | Status: AC | PRN
  Administered 2021-09-09: 0.25 mg via SUBCUTANEOUS
  Filled 2021-09-08: qty 1

## 2021-09-08 MED ORDER — MISOPROSTOL 50MCG HALF TABLET: 50.0000 ug | ORAL_TABLET | Freq: Once | ORAL | Status: DC

## 2021-09-08 MED ORDER — LABETALOL HCL 5 MG/ML IV SOLN
80.0000 mg | INTRAVENOUS | Status: DC | PRN
  Administered 2021-09-08: 80 mg via INTRAVENOUS

## 2021-09-08 MED ORDER — SOD CITRATE-CITRIC ACID 500-334 MG/5ML PO SOLN: 30.0000 mL | ORAL | Status: DC | PRN

## 2021-09-08 MED ORDER — LACTATED RINGERS IV SOLN
500.0000 mL | INTRAVENOUS | Status: DC | PRN
  Administered 2021-09-09 (×2): 500 mL via INTRAVENOUS

## 2021-09-08 MED ORDER — LABETALOL HCL 5 MG/ML IV SOLN
20.0000 mg | INTRAVENOUS | Status: DC | PRN
Start: 2021-09-08 — End: 2021-09-08
  Administered 2021-09-08 (×2): 20 mg via INTRAVENOUS
  Filled 2021-09-08 (×3): qty 4

## 2021-09-08 MED ORDER — LACTATED RINGERS IV SOLN: INTRAVENOUS | Status: DC

## 2021-09-08 MED ORDER — CALCIUM GLUCONATE 10 % IV SOLN
INTRAVENOUS | Status: AC
  Filled 2021-09-08: qty 10

## 2021-09-08 MED ORDER — HYDRALAZINE HCL 20 MG/ML IJ SOLN: 10.0000 mg | INTRAMUSCULAR | Status: DC | PRN

## 2021-09-08 MED ORDER — NIFEDIPINE ER OSMOTIC RELEASE 30 MG PO TB24
30.0000 mg | ORAL_TABLET | Freq: Every day | ORAL | Status: DC
  Administered 2021-09-08 – 2021-09-10 (×3): 30 mg via ORAL
  Filled 2021-09-08 (×3): qty 1

## 2021-09-08 MED ORDER — OXYTOCIN-SODIUM CHLORIDE 30-0.9 UT/500ML-% IV SOLN
1.0000 m[IU]/min | INTRAVENOUS | Status: DC
  Administered 2021-09-08: 2 m[IU]/min via INTRAVENOUS

## 2021-09-08 MED ORDER — MAGNESIUM SULFATE 40 GM/1000ML IV SOLN
2.0000 g/h | INTRAVENOUS | Status: DC
  Administered 2021-09-08 – 2021-09-10 (×3): 2 g/h via INTRAVENOUS
  Filled 2021-09-08 (×3): qty 1000

## 2021-09-08 MED ORDER — ONDANSETRON HCL 4 MG/2ML IJ SOLN
4.0000 mg | Freq: Four times a day (QID) | INTRAMUSCULAR | Status: DC | PRN
Start: 2021-09-08 — End: 2021-09-10
  Administered 2021-09-09: 4 mg via INTRAVENOUS
  Filled 2021-09-08: qty 2

## 2021-09-08 MED ORDER — ACETAMINOPHEN 325 MG PO TABS
650.0000 mg | ORAL_TABLET | ORAL | Status: DC | PRN
Start: 2021-09-08 — End: 2021-09-10
  Administered 2021-09-09 (×2): 650 mg via ORAL

## 2021-09-08 MED ORDER — FENTANYL CITRATE (PF) 100 MCG/2ML IJ SOLN
50.0000 ug | INTRAMUSCULAR | Status: DC | PRN
  Administered 2021-09-09: 100 ug via INTRAVENOUS
  Filled 2021-09-08: qty 2

## 2021-09-08 MED ORDER — MISOPROSTOL 25 MCG QUARTER TABLET: 25.0000 ug | ORAL_TABLET | Freq: Once | ORAL | Status: DC

## 2021-09-08 MED ORDER — OXYTOCIN-SODIUM CHLORIDE 30-0.9 UT/500ML-% IV SOLN
INTRAVENOUS | Status: AC
  Filled 2021-09-08: qty 500

## 2021-09-08 MED ORDER — AMMONIA AROMATIC IN INHA
RESPIRATORY_TRACT | Status: AC
  Filled 2021-09-08: qty 10

## 2021-09-08 MED ORDER — LABETALOL HCL 5 MG/ML IV SOLN
40.0000 mg | INTRAVENOUS | Status: DC | PRN
  Filled 2021-09-08 (×2): qty 8

## 2021-09-08 MED ORDER — HYDRALAZINE HCL 20 MG/ML IJ SOLN
10.0000 mg | INTRAMUSCULAR | Status: DC | PRN
  Administered 2021-09-08: 10 mg via INTRAVENOUS
  Filled 2021-09-08: qty 1

## 2021-09-08 MED ORDER — LIDOCAINE HCL (PF) 1 % IJ SOLN
INTRAMUSCULAR | Status: AC
  Filled 2021-09-08: qty 30

## 2021-09-08 NOTE — Progress Notes (Addendum)
Routine Prenatal Care Visit  Subjective  Kristina Kim is a 31 y.o. G1P0000 at [redacted]w[redacted]d being seen today for ongoing prenatal care.  She is currently monitored for the following issues for this low-risk pregnancy and has Abnormal cervical Papanicolaou smear; Breast abscess; Cervical dysplasia; Supervision of normal pregnancy; and Hypertension affecting pregnancy on their problem list.  ----------------------------------------------------------------------------------- Patient reports bilateral epigastric pain. She denies headache or visual changes. Reviewed s/s associated with gHTN/preeclampsia. Her questions are answered.  Contractions: Not present. Vag. Bleeding: None.  Movement: Present. Leaking Fluid denies.  ----------------------------------------------------------------------------------- The following portions of the patient's history were reviewed and updated as appropriate: allergies, current medications, past family history, past medical history, past social history, past surgical history and problem list. Problem list updated.  Objective  Blood pressure (!) 140/82, weight 193 lb (87.5 kg), last menstrual period 12/19/2020. Pregravid weight 160 lb (72.6 kg) Total Weight Gain 33 lb (15 kg) Urinalysis: Urine Protein Moderate (2+)  Urine Glucose Negative  Fetal Status: Fetal Heart Rate (bpm): 132 Fundal Height: 39 cm Movement: Present     General:  Alert, oriented and cooperative. Patient is in no acute distress.  Skin: Skin is warm and dry. No rash noted.   Cardiovascular: Normal heart rate noted  Respiratory: Normal respiratory effort, no problems with respiration noted  Abdomen: Soft, gravid, appropriate for gestational age. Pain/Pressure: Absent     Pelvic:  Cervical exam deferred        Extremities: Normal range of motion.  Edema: None  Mental Status: Normal mood and affect. Normal behavior. Normal judgment and thought content.   Assessment   31 y.o. G1P0000 at [redacted]w[redacted]d by   09/25/2021, by Last Menstrual Period presenting for routine prenatal visit  Plan   pregnancy Problems (from 02/17/21 to present)     Problem Noted Resolved   Hypertension affecting pregnancy 03/19/2021 by [name], CNM No   Overview Signed 03/19/2021 10:13 AM by [name], CNM    Noted at [facility]      Supervision of normal pregnancy 02/20/2021 by Ellwood Sayers, CNM No   Overview Addendum 09/01/2021 10:26 PM by [name], CNM     Nursing Staff Provider  Office Location  [facility] Dating  [redacted]w[redacted]d on 2/6   Language  English  Anatomy US  Normal anatomy. Does not know gender  Flu Vaccine   Genetic Screen  NIPS:   TDaP vaccine   07/18/21 Hgb A1C or  GTT Early : Third trimester :   Covid    LAB RESULTS   Rhogam  NA Blood Type B/Positive/-- (03/08 1040)   Feeding Plan Breast  Antibody Negative (03/08 1040)  Contraception Nexplanon? Rubella 3.50 (03/08 1040)  Circumcision  RPR Non Reactive (03/08 1040)   Pediatrician   HBsAg Negative (03/08 1040)   Support Person [name] HIV Non Reactive (03/08 1040)  Prenatal Classes  Varicella NON immune     GBS  (For PCN allergy, check sensitivities) negative  BTL Consent     VBAC Consent  Pap  02/17/21, neg     Hgb Electro    Pelvis Tested  CF      SMA                    Term labor symptoms and general obstetric precautions including but not limited to vaginal bleeding, contractions, leaking of fluid and fetal movement were reviewed in detail with the patient. Please refer to After Visit Summary for other counseling recommendations.  Recommend go to [facility] for PIH eval- unit and CNM notified  Return in about 1 week (around 09/15/2021) for rob.  Tresea Mall, CNM 09/08/2021 8:47 AM

## 2021-09-08 NOTE — Progress Notes (Addendum)
Kristina Kim is a 31 y.o. G1P0000 at [redacted]w[redacted]d by ultrasound admitted for induction of labor due to Pre-eclamptic toxemia of pregnancy..She has had several doses of cytotec, and a foley ball that just fell out. Now on maintenance magnesium sulphate infusion at 2 gms/hr.  Subjective:She is now feeling cramping, and can talk through these contractions. Her mother and father are at the bedside and supportive. The foley ball fell out when she got up to the bathroom.    Objective: BP 135/61   Pulse 89   Temp 98.2 F (36.8 C) (Oral)   Resp 15   Ht 5' (1.524 m)   Wt 88 kg   LMP 12/19/2020 (Exact Date)   BMI 37.89 kg/m  I/O last 3 completed shifts: In: 898.1 [I.V.:898.1] Out: -  Total I/O In: 212.7 [P.O.:50; I.V.:162.7] Out: 35 [Urine:35]  FHT:  FHR: 130 bpm, variability: minimal ,  accelerations:  Abscent,  decelerations:  Absent UC:   regular, every 1.5-4  minutes SVE:   Dilation: 2 Effacement (%): 70 Station: -3, -2 Exam by:: Eunice Blase CNM Bishop score is 6 Labs: Lab Results  Component Value Date   WBC 8.4 09/08/2021   HGB 12.9 09/08/2021   HCT 40.5 09/08/2021   MCV 83.7 09/08/2021   PLT 239 09/08/2021    Assessment / Plan: Cervical ripening in progress. + cervical change. Foley ball out.   Plan to start pitocin 4 hours after last dose of cytotec Consider epidural once she is 3 cms.  Labor: Last dose of Cytotec at 1915.[name] score is adequate to start pitocin next Preeclampsia:  on magnesium sulfate and no signs or symptoms of toxicity Fetal Wellbeing:  Category II Pain Control:  Labor support without medications I/D:  n/a Anticipated MOD:  NSVD  [name], CNM 09/08/2021, 9:03 PM

## 2021-09-08 NOTE — OB Triage Note (Addendum)
Patient is a 31 yo, G1P0, at 37 weeks 4 days. Patient presents to L&D triage after being sent over from the office for elevated blood pressures. Patient denies any vision changes or headaches. Patient reports slight right upper epigastric pain that she notices when she lies down at night. Patient reports +FM. She denies any vaginal, bleeding, LOF, or consistent contractions. Monitors applied and assessing. VSS. Initial fetal heart tone 140. Eunice Blase CNM notified of patients arrival to the unit. Plan to place in observation for PIH eval.

## 2021-09-08 NOTE — H&P (Cosign Needed Addendum)
Kristina Kim is a 31 y.o. female presenting for induction of labor secondary to a new diagnosis of pre eclampsia.She was seen at the office this morning, and had several elevated blood pressures. She was then triaged, and her in -patient labs confirm pre eclampsia at 37 weeks 4 days gestation. She denies any headache, blurred vision or new edema. She denies any chest or epigastric pain, and her baby has been moving well. No contractions reported. OB History     Gravida  1   Para  0   Term  0   Preterm  0   AB  0   Living  0      SAB  0   IAB  0   Ectopic  0   Multiple  0   Live Births  0          Past Medical History:  Diagnosis Date   Abnormal Pap smear of cervix    Anxiety    Asthma    mild, no inhalers   Seasonal allergies    Supervision of normal first pregnancy, antepartum    Past Surgical History:  Procedure Laterality Date   COLPOSCOPY  2015   abnormal Pap [facility]   INCISION AND DRAINAGE ABSCESS Right 03/10/2019   Procedure: INCISION AND DRAINAGE ABSCESS;  Surgeon: Sung Amabile, DO;  Location: [facility] ORS;  Service: General;  Laterality: Right;   INCISION AND DRAINAGE ABSCESS Right 10/04/2019   Procedure: INCISION AND DRAINAGE BREAST ABSCESS;  Surgeon: Sung Amabile, DO;  Location: [facility] ORS;  Service: General;  Laterality: Right;   Family History: family history includes Breast cancer in her maternal grandfather and paternal grandmother; Cancer (age of onset: 40) in her maternal grandmother. Social History:  reports that she has never smoked. She has never used smokeless tobacco. She reports that she does not currently use alcohol. She reports that she does not currently use drugs after having used the following drugs: Marijuana.     Maternal Diabetes: No Genetic Screening: Normal Maternal Ultrasounds/Referrals: Normal Fetal Ultrasounds or other Referrals:  Referred to Materal Fetal Medicine  Maternal Substance Abuse:  Yes:  Type: Marijuana Significant Maternal  Medications:  None Significant Maternal Lab Results:  Group B Strep negative Number of Prenatal Visits:greater than 3 verified prenatal visits Other Comments:  None  Review of Systems  Constitutional: Negative.   HENT: Negative.    Eyes: Negative.   Respiratory: Negative.    Cardiovascular: Negative.   Gastrointestinal: Negative.   Endocrine: Negative.   Genitourinary: Negative.   Allergic/Immunologic: Negative.   Neurological: Negative.   Hematological: Negative.   Psychiatric/Behavioral: Negative.     History Dilation: Closed Effacement (%): Thick Exam by:: FryerCNM Blood pressure (!) 143/79, pulse 84, temperature 98 F (36.7 C), temperature source Axillary, resp. rate 16, height 5' (1.524 m), weight 88 kg, last menstrual period 12/19/2020. Maternal Exam:  Uterine Assessment: Contraction frequency is rare.  Abdomen: Fetal presentation: vertex Introitus: Normal vulva. Normal vagina.  Pelvis: adequate for delivery.   Cervix: Cervix evaluated by digital exam.     Physical Exam Constitutional:      Appearance: Normal appearance. She is obese.  Cardiovascular:     Rate and Rhythm: Normal rate and regular rhythm.     Pulses: Normal pulses.     Heart sounds: Normal heart sounds.  Pulmonary:     Effort: Pulmonary effort is normal.     Breath sounds: Normal breath sounds.  Abdominal:     Comments: Gravid abdomen. BMI  36 + Adipose.  Genitourinary:    General: Normal vulva.  Musculoskeletal:        General: Normal range of motion.     Cervical back: Normal range of motion and neck supple.  Skin:    General: Skin is warm and dry.  Neurological:     General: No focal deficit present.     Mental Status: She is alert and oriented to person, place, and time.  Psychiatric:        Mood and Affect: Mood normal.        Behavior: Behavior normal.     Prenatal labs: ABO, Rh: --/--/B POS (08/28 1134) Antibody: NEG (08/28 1134) Rubella: 3.50 (03/08 1040) RPR: Non Reactive  (06/21 0925)  HBsAg: Negative (03/08 1040)  HIV: Non Reactive (06/21 0925)  GBS: Negative/-- (08/17 0817)   Assessment/Plan: IUP 37 weeks 4 days Pre eclampsia Admitted for induction of labor Continuous EFM IV infusion of lactated Ringers PIH labs have been drawn and reviewed Bishop score: 1 (closed/long/-3, posterior cervix Will start with cervical ripening using oral and vaginal cytotec Labetalol for severe range pressures per protocol Will start magnesium sulphate based on patient's overall VS, and once pitocin  in use for induction.  Recheck q 4-6 hours after Cytotec placed.  Dr. Logan Bores is aware of, and agrees with POC.    [name] 09/08/2021, 1:52 PM

## 2021-09-08 NOTE — Progress Notes (Signed)
Kristina Kim in department on phone with Logan Bores MD, updated on patient blood pressures. CNM reviewed VS and fhr tracing.

## 2021-09-08 NOTE — Progress Notes (Signed)
Kristina Kim is a 31 y.o. G1P0000 at [redacted]w[redacted]d by ultrasound admitted for induction of labor due to Pre-eclamptic toxemia of pregnancy.. Due to ongoing elevated blood pressures, she has been treated with IV Labetalol and Hydralazine.   Subjective:She is feeling contractions, although they are described as mild. She denies any leaking of fluid. Also denies any headache, visual changes or chest pain.   Objective: BP (!) 140/70   Pulse 90   Temp 99 F (37.2 C) (Oral)   Resp 13   Ht 5' (1.524 m)   Wt 88 kg   LMP 12/19/2020 (Exact Date)   BMI 37.89 kg/m  No intake/output data recorded. No intake/output data recorded.  FHT:  FHR: 135 bpm, variability: minimal ,  accelerations:  Abscent,  decelerations:  Present after receiving Hydralazine, several late decels noted with contractions UC:   regular, every 6 minutes SVE:   Dilation: 1.5 Effacement (%): 60% Exam by:: Eunice Blase CNM  Labs: Lab Results  Component Value Date   WBC 8.4 09/08/2021   HGB 12.9 09/08/2021   HCT 40.5 09/08/2021   MCV 83.7 09/08/2021   PLT 239 09/08/2021    Assessment / Plan: Induction of labor due to preeclampsia,  + cervical change noted. Hypertension has continued and required repeat doses of Labetalol. Will start magnesium sulphate.  Labor: Progressing normally Preeclampsia:   will start the magnesium sulphate now Fetal Wellbeing:  Category II Pain Control:  Labor support without medications I/D:  n/a Anticipated MOD:  NSVD Called and discussed the FHT tracing with Dr. Logan Bores. Will proceed with IV bolus. Procardia 30 XL ordered, to be given now Will monitor FHTS carefully Dr. Logan Bores has reviewed the FHT strip at my request. Will place foley ball.  [name], CNM 09/08/2021, 6:37 PM

## 2021-09-08 NOTE — Lactation Note (Signed)
Lactation Consultation Note  Patient Name: Kristina Kim JIRCV'E Date: 09/08/2021 Reason for consult: L&D Initial assessment;Nipple pain/trauma;Other (Comment);1st time breastfeeding (hx: R breast with cyst removal/abcess) Age:31 y.o. Mother has hx of cyst/abscess removal on her right breast. The scar is located at about 3 o'clock on the areola. Mother has questions about being able to breastfeed or pump and express her breast milk from the affected side. Reviewed as long as there is no active infection it is safe to provide breast milk or feed from the right side. If she develops signs of another abscess, fever, flu like symptoms, red hardened area or drainage from the area then she would want to pump and dump and seek Medical attention until healed again. The nipple on the right is shorter than the left. LC attempted to hand express colostrum from the right but Mother is very sensitive to touch on the right with nipple and areola. Discussed she has the option of exclusive breastfeeding on the left, breast and formula bottle, or also latching or pumping on the right as well. Mother has questions about formula as well. She does not have a pump at home but stated she will need to get one. WIC status unknown at this time.    Interventions Interventions:  (discussed options of breastfeeding or pumping on the right if MOB wanted)  Discharge Pump:  (needs a pump) WIC Program: Yes  Consult Status Consult Status: Follow-up from L&D  Leston Schueller D Geremiah Fussell 09/08/2021, 1:28 PM

## 2021-09-08 NOTE — Progress Notes (Signed)
Eunice Blase CNM at bedside to update patient on lab results and plan of care. Plan to admit to labor and delivery for induction for pree. Patient agreeable to plan. Questions answered by CNM and RN.

## 2021-09-08 NOTE — Progress Notes (Signed)
Pt sitting eating meal per Eunice Blase CNM verbal order. [place] adjusted.

## 2021-09-09 ENCOUNTER — Inpatient Hospital Stay: Payer: BC Managed Care – PPO | Admitting: Anesthesiology

## 2021-09-09 ENCOUNTER — Other Ambulatory Visit: Payer: Self-pay

## 2021-09-09 ENCOUNTER — Encounter
Admission: RE | Disposition: A | Discharge status: Home / Self Care | Payer: Self-pay | Source: Ambulatory Visit | Attending: Advanced Practice Midwife

## 2021-09-09 DIAGNOSIS — O1494 Unspecified pre-eclampsia, complicating childbirth: Secondary | ICD-10-CM

## 2021-09-09 DIAGNOSIS — O164 Unspecified maternal hypertension, complicating childbirth: Secondary | ICD-10-CM

## 2021-09-09 DIAGNOSIS — Z3A37 37 weeks gestation of pregnancy: Secondary | ICD-10-CM

## 2021-09-09 LAB — CBC
HCT: 46.6 % — ABNORMAL HIGH (ref 36.0–46.0)
Hemoglobin: 14.4 g/dL (ref 12.0–15.0)
MCH: 26.8 pg (ref 26.0–34.0)
MCHC: 30.9 g/dL (ref 30.0–36.0)
MCV: 86.6 fL (ref 80.0–100.0)
Platelets: 300 10*3/uL (ref 150–400)
RBC: 5.38 MIL/uL — ABNORMAL HIGH (ref 3.87–5.11)
RDW: 15.5 % (ref 11.5–15.5)
WBC: 13.8 10*3/uL — ABNORMAL HIGH (ref 4.0–10.5)
nRBC: 0 % (ref 0.0–0.2)

## 2021-09-09 LAB — RPR: RPR Ser Ql: NONREACTIVE

## 2021-09-09 SURGERY — Surgical Case
Anesthesia: Epidural

## 2021-09-09 MED ORDER — MORPHINE SULFATE (PF) 0.5 MG/ML IJ SOLN
INTRAMUSCULAR | Status: AC
  Filled 2021-09-09: qty 10

## 2021-09-09 MED ORDER — LIDOCAINE HCL (PF) 2 % IJ SOLN
INTRAMUSCULAR | Status: DC | PRN
  Administered 2021-09-09 (×3): 20 mg via EPIDURAL

## 2021-09-09 MED ORDER — MORPHINE SULFATE (PF) 0.5 MG/ML IJ SOLN
INTRAMUSCULAR | Status: DC | PRN
  Administered 2021-09-09: 2 mg via EPIDURAL

## 2021-09-09 MED ORDER — SOD CITRATE-CITRIC ACID 500-334 MG/5ML PO SOLN
ORAL | Status: AC
  Filled 2021-09-09: qty 15

## 2021-09-09 MED ORDER — BUPIVACAINE-MELOXICAM ER 400-12 MG/14ML IJ SOLN
INTRAMUSCULAR | Status: AC
  Filled 2021-09-09: qty 1

## 2021-09-09 MED ORDER — EPHEDRINE 5 MG/ML INJ
10.0000 mg | INTRAVENOUS | Status: DC | PRN
Start: 2021-09-09 — End: 2021-09-10

## 2021-09-09 MED ORDER — SODIUM CHLORIDE 0.9 % IV SOLN
500.0000 mg | INTRAVENOUS | Status: AC
  Administered 2021-09-09: 500 mg via INTRAVENOUS
  Filled 2021-09-09: qty 5

## 2021-09-09 MED ORDER — DIPHENHYDRAMINE HCL 25 MG PO CAPS
50.0000 mg | ORAL_CAPSULE | Freq: Once | ORAL | Status: AC
  Administered 2021-09-09: 50 mg via ORAL
  Filled 2021-09-09: qty 2

## 2021-09-09 MED ORDER — BUPIVACAINE LIPOSOME 1.3 % IJ SUSP: Freq: Once | INTRAMUSCULAR | Status: DC

## 2021-09-09 MED ORDER — PHENYLEPHRINE 80 MCG/ML (10ML) SYRINGE FOR IV PUSH (FOR BLOOD PRESSURE SUPPORT): 80.0000 ug | PREFILLED_SYRINGE | INTRAVENOUS | Status: DC | PRN

## 2021-09-09 MED ORDER — BUPIVACAINE LIPOSOME 1.3 % IJ SUSP
INTRAMUSCULAR | Status: AC
  Filled 2021-09-09: qty 20

## 2021-09-09 MED ORDER — SOD CITRATE-CITRIC ACID 500-334 MG/5ML PO SOLN
30.0000 mL | ORAL | Status: AC
  Administered 2021-09-09: 30 mL via ORAL

## 2021-09-09 MED ORDER — SODIUM CHLORIDE (PF) 0.9 % IJ SOLN
INTRAMUSCULAR | Status: AC
  Filled 2021-09-09: qty 50

## 2021-09-09 MED ORDER — MISOPROSTOL 200 MCG PO TABS
ORAL_TABLET | ORAL | Status: AC
  Filled 2021-09-09: qty 5

## 2021-09-09 MED ORDER — LACTATED RINGERS IV SOLN
500.0000 mL | Freq: Once | INTRAVENOUS | Status: AC
  Administered 2021-09-09: 500 mL via INTRAVENOUS

## 2021-09-09 MED ORDER — METHYLERGONOVINE MALEATE 0.2 MG/ML IJ SOLN
INTRAMUSCULAR | Status: AC
  Filled 2021-09-09: qty 1

## 2021-09-09 MED ORDER — DIPHENHYDRAMINE HCL 50 MG/ML IJ SOLN: 12.5000 mg | INTRAMUSCULAR | Status: DC | PRN

## 2021-09-09 MED ORDER — EPHEDRINE 5 MG/ML INJ: 10.0000 mg | INTRAVENOUS | Status: DC | PRN

## 2021-09-09 MED ORDER — PHENYLEPHRINE HCL-NACL 20-0.9 MG/250ML-% IV SOLN
INTRAVENOUS | Status: AC
  Filled 2021-09-09: qty 250

## 2021-09-09 MED ORDER — LIDOCAINE HCL (PF) 1 % IJ SOLN
INTRAMUSCULAR | Status: DC | PRN
  Administered 2021-09-09: 3 mL

## 2021-09-09 MED ORDER — PHENYLEPHRINE HCL (PRESSORS) 10 MG/ML IV SOLN
INTRAVENOUS | Status: DC | PRN
  Administered 2021-09-09 (×2): 80 ug via INTRAVENOUS

## 2021-09-09 MED ORDER — PHENYLEPHRINE HCL-NACL 20-0.9 MG/250ML-% IV SOLN
INTRAVENOUS | Status: DC | PRN
  Administered 2021-09-09: 30 ug/min via INTRAVENOUS

## 2021-09-09 MED ORDER — CEFAZOLIN SODIUM-DEXTROSE 2-4 GM/100ML-% IV SOLN
2.0000 g | INTRAVENOUS | Status: AC
  Administered 2021-09-09: 2 g via INTRAVENOUS
  Filled 2021-09-09: qty 100

## 2021-09-09 MED ORDER — FENTANYL-BUPIVACAINE-NACL 0.5-0.125-0.9 MG/250ML-% EP SOLN
12.0000 mL/h | EPIDURAL | Status: DC | PRN
  Administered 2021-09-09: 10 mL/h via EPIDURAL

## 2021-09-09 MED ORDER — FENTANYL-BUPIVACAINE-NACL 0.5-0.125-0.9 MG/250ML-% EP SOLN
EPIDURAL | Status: AC
  Filled 2021-09-09: qty 250

## 2021-09-09 MED ORDER — TRANEXAMIC ACID-NACL 1000-0.7 MG/100ML-% IV SOLN
INTRAVENOUS | Status: AC
  Filled 2021-09-09: qty 100

## 2021-09-09 MED ORDER — ONDANSETRON HCL 4 MG/2ML IJ SOLN
INTRAMUSCULAR | Status: DC | PRN
  Administered 2021-09-09: 4 mg via INTRAVENOUS

## 2021-09-09 MED ORDER — BUPIVACAINE HCL (PF) 0.5 % IJ SOLN
INTRAMUSCULAR | Status: AC
  Filled 2021-09-09: qty 30

## 2021-09-09 MED ORDER — LIDOCAINE HCL (PF) 2 % IJ SOLN
INTRAMUSCULAR | Status: AC
  Filled 2021-09-09: qty 20

## 2021-09-09 MED ORDER — OXYTOCIN-SODIUM CHLORIDE 30-0.9 UT/500ML-% IV SOLN
INTRAVENOUS | Status: DC | PRN
  Administered 2021-09-09: 299 mL via INTRAVENOUS
  Administered 2021-09-09: 1 mL via INTRAVENOUS

## 2021-09-09 MED ORDER — PHENYLEPHRINE 80 MCG/ML (10ML) SYRINGE FOR IV PUSH (FOR BLOOD PRESSURE SUPPORT)
80.0000 ug | PREFILLED_SYRINGE | INTRAVENOUS | Status: DC | PRN
Start: 2021-09-09 — End: 2021-09-10

## 2021-09-09 MED ORDER — LIDOCAINE-EPINEPHRINE (PF) 1.5 %-1:200000 IJ SOLN
INTRAMUSCULAR | Status: DC | PRN
  Administered 2021-09-09: 3 mL via PERINEURAL

## 2021-09-09 MED ORDER — CARBOPROST TROMETHAMINE 250 MCG/ML IM SOLN
INTRAMUSCULAR | Status: AC
  Filled 2021-09-09: qty 1

## 2021-09-09 MED ORDER — BUPIVACAINE HCL (PF) 0.25 % IJ SOLN
INTRAMUSCULAR | Status: DC | PRN
  Administered 2021-09-09: 2 mL via EPIDURAL
  Administered 2021-09-09: 3 mL via EPIDURAL

## 2021-09-09 SURGICAL SUPPLY — 29 items
BAG COUNTER SPONGE SURGICOUNT (BAG) ×1 IMPLANT
CHLORAPREP W/TINT 26 (MISCELLANEOUS) ×2 IMPLANT
DRSG TELFA 3X8 NADH STRL (GAUZE/BANDAGES/DRESSINGS) ×1 IMPLANT
ELECT REM PT RETURN 9FT ADLT (ELECTROSURGICAL) ×1
ELECTRODE REM PT RTRN 9FT ADLT (ELECTROSURGICAL) ×1 IMPLANT
EXTRT SYSTEM ALEXIS 17CM (MISCELLANEOUS)
GAUZE SPONGE 4X4 12PLY STRL (GAUZE/BANDAGES/DRESSINGS) ×1 IMPLANT
GAUZE SPONGE 4X4 8PLY STRL (GAUZE/BANDAGES/DRESSINGS) IMPLANT
GLOVE BIO SURGEON STRL SZ 6.5 (GLOVE) ×1 IMPLANT
GLOVE SURG UNDER LTX SZ7 (GLOVE) ×1 IMPLANT
GOWN STRL REUS W/ TWL LRG LVL3 (GOWN DISPOSABLE) ×2 IMPLANT
GOWN STRL REUS W/TWL LRG LVL3 (GOWN DISPOSABLE) ×2
KIT TURNOVER KIT A (KITS) ×1 IMPLANT
MANIFOLD NEPTUNE II (INSTRUMENTS) ×1 IMPLANT
MAT PREVALON FULL STRYKER (MISCELLANEOUS) ×1 IMPLANT
NS IRRIG 1000ML POUR BTL (IV SOLUTION) ×1 IMPLANT
PACK C SECTION AR (MISCELLANEOUS) ×1 IMPLANT
PAD OB MATERNITY 4.3X12.25 (PERSONAL CARE ITEMS) ×1 IMPLANT
PAD PREP 24X41 OB/GYN DISP (PERSONAL CARE ITEMS) ×1 IMPLANT
SCRUB CHG 4% DYNA-HEX 4OZ (MISCELLANEOUS) ×1 IMPLANT
STRIP CLOSURE SKIN 1X5 (GAUZE/BANDAGES/DRESSINGS) IMPLANT
SUT MNCRL AB 4-0 PS2 18 (SUTURE) ×1 IMPLANT
SUT PLAIN 2 0 XLH (SUTURE) IMPLANT
SUT VIC AB 0 CT1 36 (SUTURE) ×4 IMPLANT
SUT VIC AB 3-0 SH 27 (SUTURE) ×1
SUT VIC AB 3-0 SH 27X BRD (SUTURE) ×1 IMPLANT
SYSTEM CONTND EXTRCTN KII BLLN (MISCELLANEOUS) IMPLANT
TRAP FLUID SMOKE EVACUATOR (MISCELLANEOUS) ×1 IMPLANT
WATER STERILE IRR 500ML POUR (IV SOLUTION) ×1 IMPLANT

## 2021-09-09 NOTE — Progress Notes (Signed)
OB Attending Progress Note  Called by nurse midwife at approximately 8:20 pm due to recurrent decelerations and minimal variability with pushing.  Pitocin held.  Upon my arrival, patient appears comfortable, no acute distress.  Pitocin resumed at 16 mIU, cervical exam 10/100/0 with small caput.  Patient allowed to push, fetus appeared to be tolerating pushing well again for ~ 25 minutes however began to have recurrent late decelerations from baseline to 90s.  Mild vaginal tissue distocia as well as prominent pubic arch noted with further descent. Fetal descent noted to be +1, however likely more caput.  Based on exam and resumption of fetal decelerations, decision was made to progress to Cesarean delivery.   The risks of surgery were discussed with the patient including but were not limited to: bleeding which may require transfusion or reoperation; infection which may require antibiotics; injury to bowel, bladder, ureters or other surrounding organs; injury to the fetus; need for additional procedures including hysterectomy in the event of a life-threatening hemorrhage; formation of adhesions; placental abnormalities with subsequent pregnancies; incisional problems; thromboembolic phenomenon and other postoperative/anesthesia complications.  The patient concurred with the proposed plan, giving informed written consent for the procedure.   Patient has been on clear liquid diet since admission, she will remain NPO for procedure. Anesthesia and OR aware. Preoperative prophylactic antibiotics and SCDs ordered on call to the OR.  To OR when ready.   Hildred Laser, MD [facility]

## 2021-09-09 NOTE — Progress Notes (Signed)
LABOR NOTE   Kristina Kim 30 y.o.GP@ at [redacted]w[redacted]d  SUBJECTIVE:  Comfortable with epidural. Pt continues to have mild headache. Denies any other symptoms Analgesia: Epidural  OBJECTIVE:  BP 111/70   Pulse 89   Temp 98.3 F (36.8 C) (Oral)   Resp 20   Ht 5' (1.524 m)   Wt 88 kg   LMP 12/19/2020 (Exact Date)   SpO2 98%   BMI 37.89 kg/m  Total I/O In: 1928.3 [P.O.:120; I.V.:1781.6; Other:26.7] Out: 385 [Urine:385]   CERVIX: deferred  SVE:   Dilation: 4 Effacement (%): 90 Station: -2 Exam by:: Doreene Burke CNM CONTRACTIONS: regular, every 3-4 minutes FHR: Fetal heart tracing reviewed. Baseline: 120 bpm, Variability: Fair (1-6 bpm), Accelerations: Non-reactive but appropriate for gestational age, and Decelerations: Absent Category II    Labs: Lab Results  Component Value Date   WBC 8.4 09/08/2021   HGB 12.9 09/08/2021   HCT 40.5 09/08/2021   MCV 83.7 09/08/2021   PLT 239 09/08/2021    ASSESSMENT: 1) Labor curve reviewed.       Progress: Prolonged latent labor.     Membranes: ruptured, clear fluid     FSE & IUPC in place       Principal Problem:   Pregnancy Active Problems:   Elevated blood pressure affecting pregnancy in third trimester, antepartum   Pre-eclampsia in third trimester   PLAN: IV fluid bolus and change maternal position Dr. Logan Bores notified of FHT and reviewed strip.   Doreene Burke, CNM  09/09/2021 1:22 PM

## 2021-09-09 NOTE — Anesthesia Preprocedure Evaluation (Signed)
Anesthesia Evaluation  Patient identified by MRN, date of birth, ID band Patient awake    Reviewed: Allergy & Precautions, H&P , NPO status , Patient's Chart, lab work & pertinent test results  Airway Mallampati: II       Dental no notable dental hx.    Pulmonary asthma ,           Cardiovascular hypertension,      Neuro/Psych Anxiety negative neurological ROS     GI/Hepatic Neg liver ROS, GERD  ,  Endo/Other  negative endocrine ROS  Renal/GU negative Renal ROS  negative genitourinary   Musculoskeletal   Abdominal   Peds  Hematology negative hematology ROS (+)   Anesthesia Other Findings   Reproductive/Obstetrics (+) Pregnancy                             Anesthesia Physical Anesthesia Plan  ASA: 2  Anesthesia Plan: Epidural   Post-op Pain Management:    Induction:   PONV Risk Score and Plan:   Airway Management Planned:   Additional Equipment:   Intra-op Plan:   Post-operative Plan:   Informed Consent:   Plan Discussed with: Anesthesiologist and CRNA  Anesthesia Plan Comments:         Anesthesia Quick Evaluation

## 2021-09-09 NOTE — Op Note (Incomplete)
Cesarean Section Procedure Note  Indications: failure to progress: arrest of descent, non-reassuring fetal status, and severe preeclampsia  Pre-operative Diagnosis: 37 week 5 day pregnancy.  Post-operative Diagnosis: Same  Surgeon: Hildred Laser, MD  Assistants:  Doreene Burke, CNM  Procedure: Primary low transverse Cesarean Section  Anesthesia: Epidural anesthesia  Findings: Female infant, cephalic presentation, 6 lbs 5 oz, with Apgar scores of *** at one minute and *** at five minutes. Intact placenta with 3 vessel cord.  Clear amniotic fluid at amniotomy Nuchal cord x 1, reduced after delivery of fetus The uterine outline, tubes and ovaries appeared normal.   Procedure Details: The patient was seen in the Holding Room. The risks, benefits, complications, treatment options, and expected outcomes were discussed with the patient.  The patient concurred with the proposed plan, giving informed consent.  The site of surgery properly noted/marked. The patient was taken to the Operating Room, identified as Kristina Kim and the procedure verified as C-Section Delivery. A Time Out was held and the above information confirmed.  After induction of anesthesia, the patient was draped and prepped in the usual sterile manner. Anesthesia was tested and noted to be adequate. A Pfannenstiel incision was made and carried down through the subcutaneous tissue to the fascia. Fascial incision was made and extended transversely. The fascia was separated from the underlying rectus tissue superiorly and inferiorly. The peritoneum was identified and entered. Peritoneal incision was extended longitudinally. The surgical assist was able to provide retraction to allow for clear visualization of surgical site. An Alexis retractor was placed in the abdomen for additional retraction. The utero-vesical peritoneal reflection was incised transversely and the bladder flap was bluntly freed from the lower uterine segment. A low  transverse uterine incision was made. Delivered from cephalic presentation was a *** gram Female with Apgar scores of {Numbers; 0-10:33138} at one minute and {Numbers; 0-10:33138} at five minutes.  The assistant was able to apply adequate fundal pressure to allow for successful delivery of the fetus. After the umbilical cord was clamped and cut, cord blood was obtained for evaluation. Delayed cord clamping was observed for 50 seconds. The placenta was removed intact and appeared normal. The uterus was exteriorized and cleared of all clots and debris. The uterine outline, tubes and ovaries appeared normal.  The uterine incision was closed with running locked sutures of 0-Vicryl.  A second suture of 0-Vicryl was used in an imbricating layer.  Hemostasis was observed. The uterus was then returned to the abdomen. The pericolic gutters were cleared of all clots and debris. The fascia was then grasped with Zannie Cove and Tresa Endo clamps, and injected with a total of 60 ml of 1.3% Exparel solution (20 ml of bupivicaine liposomal mixed with 30 ml of 0.5% Marcaine and diluted in 50 ml of normal saline).  The fascia was then reapproximated with a running suture of 0-Vicryl. The subcutaneous fat layer was reapproximated with 2-0 Vicryl. The skin was reapproximated with 4-0 Monocryl. The skin and subcutaneous tissues were then injected with an additional 40 ml of the Exparel solution. The incision was covered with steri-strips and a pressure dressing.   Instrument, sponge, and needle counts were correct prior the abdominal closure and at the conclusion of the case.    An experienced assistant was required given the standard of surgical care given the complexity of the case.  This assistant was needed for exposure, dissection, suctioning, retraction, instrument exchange, and for overall help during the procedure.  Estimated Blood Loss:  350 ml  Drains: foley catheter to gravity drainage, *** ml of clear urine at end of the  procedure         Total IV Fluids:  Total I/O In: 530.4 [I.V.:530.4] Out: 505 [Urine:155; Blood:350] ml  Specimens:          Implants: None         Complications:  None; patient tolerated the procedure well.         Disposition: PACU - hemodynamically stable.         Condition: stable   Hildred Laser, MD [facility]

## 2021-09-09 NOTE — Progress Notes (Signed)
Kristina Kim is a 31 y.o. G1P0000 at [redacted]w[redacted]d who continues with induction of labor secondary to pre eclampsia, diagnosed yesterday afternoon.she has had a foley ball placed (for several ours only) and several doses of Cytotec. Now continues on pitocin.Her magnesium sulphate is running on 2gms/hour.  Subjective:  Kristina Kim has slept for several hours. Getting up to the bathroom often.she continues to deny any headaches or visual changes. Feeling her contractions, which continue to feel mild.  Objective: BP 116/64   Pulse (!) 101   Temp (!) 97.3 F (36.3 C) (Oral)   Resp 18   Ht 5' (1.524 m)   Wt 88 kg   LMP 12/19/2020 (Exact Date)   SpO2 100%   BMI 37.89 kg/m  I/O last 3 completed shifts: In: 898.1 [I.V.:898.1] Out: -  Total I/O In: 1776.1 [P.O.:340; I.V.:1436.1] Out: 1385 [Urine:1385]  FHT:  FHR: 125 bpm, variability: minimal, with rare increases in variability,  accelerations:  Abscent,  decelerations:  Absent UC:   regular, every 1.5-2  minutes SVE:   Dilation: 2 Effacement (%): 70 Station: -2, -1 Exam by:: Liana Crocker CNM  Labs: Lab Results  Component Value Date   WBC 8.4 09/08/2021   HGB 12.9 09/08/2021   HCT 40.5 09/08/2021   MCV 83.7 09/08/2021   PLT 239 09/08/2021    Assessment / Plan: Discussed ongoing induction with patient and her family. Proceeded with placement of another foley catheter.Sterile water- 30 ccs placed in foley ball. Will continue with pitocin at half present dose, increasing by 52mu/min q 30 min.   Preeclampsia:  on magnesium sulfate and no signs or symptoms of toxicity Fetal Wellbeing:  Category II continues with minimal variability. Position changes encouraged. Pain Control:  IV pain meds requested. Fentanyl 100 mg given IV. She plans epidural.  I/D:  n/a Anticipated MOD:  NSVD Noticeably uncomfortable once foley catheter placed.    [name], CNM 09/09/2021, 6:18 AM

## 2021-09-09 NOTE — Anesthesia Procedure Notes (Addendum)
Epidural Patient location during procedure: OB Start time: 09/09/2021 9:39 AM End time: 09/09/2021 9:45 AM  Staffing Anesthesiologist: Reed Breech, MD Resident/CRNA: Jaye Beagle, CRNA Performed: resident/CRNA   Preanesthetic Checklist Completed: patient identified, IV checked, site marked, risks and benefits discussed, surgical consent, monitors and equipment checked, pre-op evaluation and timeout performed  Epidural Patient position: sitting Prep: ChloraPrep Patient monitoring: heart rate, continuous pulse ox and blood pressure Approach: midline Location: L3-L4 Injection technique: LOR saline  Needle:  Needle type: Tuohy  Needle gauge: 17 G Needle length: 9 cm and 9 Needle insertion depth: 8 cm Catheter type: closed end flexible Catheter size: 19 Gauge Catheter at skin depth: 13 cm Test dose: negative and 1.5% lidocaine with Epi 1:200 K  Assessment Sensory level: T10 Events: blood not aspirated, injection not painful, no injection resistance, no paresthesia and negative IV test  Additional Notes 2 attempt Pt. Evaluated and documentation done after procedure finished. Patient identified. Risks/Benefits/Options discussed with patient including but not limited to bleeding, infection, nerve damage, paralysis, failed block, incomplete pain control, headache, blood pressure changes, nausea, vomiting, reactions to medication both or allergic, itching and postpartum back pain. Confirmed with bedside nurse the patient's most recent platelet count. Confirmed with patient that they are not currently taking any anticoagulation, have any bleeding history or any family history of bleeding disorders. Patient expressed understanding and wished to proceed. All questions were answered. Sterile technique was used throughout the entire procedure. Please see nursing notes for vital signs. Test dose was given through epidural catheter and negative prior to continuing to dose epidural or  start infusion. Warning signs of high block given to the patient including shortness of breath, tingling/numbness in hands, complete motor block, or any concerning symptoms with instructions to call for help. Patient was given instructions on fall risk and not to get out of bed. All questions and concerns addressed with instructions to call with any issues or inadequate analgesia.    Patient tolerated the insertion well without immediate complications.Reason for block:procedure for pain

## 2021-09-09 NOTE — Progress Notes (Signed)
Pitocin restarted per CNM and MD. Providers aware of FHR Tracing.

## 2021-09-09 NOTE — Progress Notes (Signed)
Intrapartum Progress Note  S: Patient without major complaints. Denies headaches, blurred vision, RUQ pain.   O: Blood pressure 131/69, pulse (!) 102, temperature 98 F (36.7 C), temperature source Oral, resp. rate 18, height 5' (1.524 m), weight 88 kg, last menstrual period 12/19/2020, SpO2 97 %. Gen App: NAD, comfortable Abdomen: soft, gravid FHT: baseline 130 bpm.  Accels absent.  Decels present (subtle late). minimal in degree variability.   Tocometer: contractions q 1-3 minutes, some coupling present Cervix: 7/90-100/-1, mild caput present Extremities: Nontender, no edema. Neurologic: + 2 reflexes in upper and lower extremities, no clonus.   Pitocin: 16 mIU  Labs:     Latest Ref Rng & Units 09/09/2021    3:44 PM 09/08/2021    9:26 AM 07/02/2021    9:25 AM  CBC  WBC 4.0 - 10.5 K/uL 13.8  8.4  10.1   Hemoglobin 12.0 - 15.0 g/dL 54.0  08.6  76.1   Hematocrit 36.0 - 46.0 % 46.6  40.5  37.1   Platelets 150 - 400 K/uL 300  239  358       Latest Ref Rng & Units 09/08/2021    9:26 AM 03/19/2021   10:40 AM 01/17/2019    3:30 AM  CMP  Glucose 70 - 99 mg/dL 95  70  97   BUN 6 - 20 mg/dL 6  4  8    Creatinine 0.44 - 1.00 mg/dL  9.50  9.32   Sodium 135 - 145 mmol/L 136  136  138   Potassium 3.5 - 5.1 mmol/L 3.5  3.6  3.7   Chloride 98 - 111 mmol/L 107  102  109   CO2 22 - 32 mmol/L 19  17  22    Calcium 8.9 - 10.3 mg/dL 9.0  9.4  8.8   Total Protein 6.5 - 8.1 g/dL 6.8  7.3    Total Bilirubin 0.3 - 1.2 mg/dL 0.4  6.71    Alkaline Phos 38 - 126 U/L 195  78    AST 15 - 41 U/L 23  15    ALT 0 - 44 U/L 23  20       Assessment:  1: SIUP at [redacted]w[redacted]d 2. Pre-eclampsia with severe features.   3. Category II tracing  Plan:  1. Called to assess patient by midwife due to concerns of fetal tracing. Lengthy discussion had regarding fetal tracing and concerns for distress. Discussed concerns for needing to proceed with C-section. Cervical exam by midwife concerning for caput.  I performed  exam with patient, noting thinning of cervix posteriorly.  Allowed patient to push to see if descent was possible. Patient had modest effort with epidural but was able to push fetal station to 0. With continued pushing efforts, fetal tracing noted to improve with return of variability and resolution of late decelerations. Patient's cervix noted to change to 9 cm.  With improvement of fetal tracing and further progression of cervical exam, can allow patient more time to labor. Tracing improved enough to increase Pitocin to 18 mIU.  Will recheck patient in 1 hour. Benadryl to be administered to prevent swelling of cervix.  2.  Pre-eclampsia with severe features,  currently on Magnesium Sulfate. Also on Procardia for BP management.    <2.4, MD 09/09/2021 6:35 PM

## 2021-09-09 NOTE — Progress Notes (Signed)
LABOR NOTE   Kristina Kim 31 y.o.GP@ at [redacted]w[redacted]d  SUBJECTIVE:  Continues to have pressure, denies that it has intensified.  Analgesia: Epidural  OBJECTIVE:  BP (!) 148/69   Pulse (!) 103   Temp 98 F (36.7 C) (Oral)   Resp 20   Ht 5' (1.524 m)   Wt 88 kg   LMP 12/19/2020 (Exact Date)   SpO2 98%   BMI 37.89 kg/m  Total I/O In: 3409.9 [P.O.:360; I.V.:2983.3; Other:66.7] Out: 470 [Urine:470]  She has shown cervical change. CERVIX: 7 cm:  100%:   -2:   mid position:   soft SVE:   Dilation: 7 Effacement (%): 100 Station: -2 Exam by:: Janee Morn CNM CONTRACTIONS: regular, every 1-5 minutes FHR: Fetal heart tracing reviewed. Baseline: 120 bpm, Variability: Fair (1-6 bpm), Accelerations: Non-reactive but appropriate for gestational age, and Decelerations: early, late   Category II      Labs: Lab Results  Component Value Date   WBC 13.8 (H) 09/09/2021   HGB 14.4 09/09/2021   HCT 46.6 (H) 09/09/2021   MCV 86.6 09/09/2021   PLT 300 09/09/2021    ASSESSMENT: 1) Labor curve reviewed.       Progress: Prolonged active phase labor.     Membranes: ruptured, clear fluid           Principal Problem:   Pregnancy Active Problems:   Elevated blood pressure affecting pregnancy in third trimester, antepartum   Pre-eclampsia in third trimester   PLAN: Dr. Valentino Saxon aware of Fetal heart rate tracing. She has reviewed the strip   Doreene Burke, [facility]  09/09/2021 5:23 PM

## 2021-09-09 NOTE — Op Note (Signed)
Cesarean Section Procedure Note  Indications: failure to progress: arrest of descent, non-reassuring fetal status, and severe preeclampsia  Pre-operative Diagnosis: 37 week 5 day pregnancy, non-reassuring fetal status, arrest of descent, severe pre-eclampsia.  Post-operative Diagnosis: Same  Surgeon: Hildred Laser, MD  Assistants:  Doreene Burke, CNM  Procedure: Primary low transverse Cesarean Section  Anesthesia: Epidural anesthesia  Findings: Female infant, cephalic presentation, 2870 grams, with Apgar scores of 1 at one minute and 8 at five minutes. Intact placenta with 3 vessel cord.  Clear amniotic fluid at amniotomy Nuchal cord x 1, reduced after delivery of fetus The uterine outline, tubes and ovaries appeared normal.   Procedure Details: The patient was seen in the Holding Room. The risks, benefits, complications, treatment options, and expected outcomes were discussed with the patient.  The patient concurred with the proposed plan, giving informed consent.  The site of surgery properly noted/marked. The patient was taken to the Operating Room, identified as Kristina Kim and the procedure verified as C-Section Delivery. A Time Out was held and the above information confirmed.  After induction of anesthesia, the patient was draped and prepped in the usual sterile manner. Anesthesia was tested and noted to be adequate. A Pfannenstiel incision was made and carried down through the subcutaneous tissue to the fascia. Fascial incision was made and extended transversely. The fascia was separated from the underlying rectus tissue superiorly and inferiorly. The peritoneum was identified and entered. Peritoneal incision was extended longitudinally. The surgical assist was able to provide retraction to allow for clear visualization of surgical site. An Alexis retractor was placed in the abdomen for additional retraction. The utero-vesical peritoneal reflection was incised transversely and the  bladder flap was bluntly freed from the lower uterine segment. A low transverse uterine incision was made. Delivered from cephalic presentation was a  gram Female with Apgar scores of 1 at one minute and 8 at five minutes (please see separate Neonatology note for details of resuscitation).  The assistant was able to apply adequate fundal pressure to allow for successful delivery of the fetus. After the umbilical cord was clamped and cut, cord blood was obtained for evaluation. Delayed cord clamping was observed for 50 seconds. The placenta was removed intact and appeared normal. The uterus was exteriorized and cleared of all clots and debris. The uterine outline, tubes and ovaries appeared normal.  The uterine incision was closed with running locked sutures of 0-Vicryl.  A second suture of 0-Vicryl was used in an imbricating layer.  Hemostasis was observed. The uterus was then returned to the abdomen. The pericolic gutters were cleared of all clots and debris. The fascia was then grasped with Zannie Cove and Tresa Endo clamps, and injected with a total of 60 ml of 1.3% Exparel solution (20 ml of bupivicaine liposomal mixed with 30 ml of 0.5% Marcaine and diluted in 50 ml of normal saline).  The fascia was then reapproximated with a running suture of 0-Vicryl. The subcutaneous fat layer was reapproximated with 2-0 Vicryl. The skin was reapproximated with 4-0 Monocryl. The skin and subcutaneous tissues were then injected with an additional 40 ml of the Exparel solution. The incision was covered with steri-strips and a pressure dressing.   Instrument, sponge, and needle counts were correct prior the abdominal closure and at the conclusion of the case.    An experienced assistant was required given the standard of surgical care given the complexity of the case.  This assistant was needed for exposure, dissection, suctioning, retraction, instrument exchange, and  for overall help during the procedure.  Estimated Blood Loss:   350 ml      Drains: foley catheter to gravity drainage, 155 ml of clear urine at end of the procedure         Total IV Fluids:  530 ml  Specimens: None         Implants: None         Complications:  None; patient tolerated the procedure well.         Disposition: PACU - hemodynamically stable.         Condition: stable   Hildred Laser, MD [facility]

## 2021-09-09 NOTE — Progress Notes (Addendum)
LABOR NOTE   Kristina Kim 31 y.o.GP@ at [redacted]w[redacted]d  SUBJECTIVE:  Denies visual change, epigastric pain. Has mild headache 3/10. Uncomfortable with contractions, requesting epidural  Analgesia: Labor support without medications  OBJECTIVE:  BP (!) 144/79   Pulse 96   Temp 98.7 F (37.1 C) (Oral)   Resp 18   Ht 5' (1.524 m)   Wt 88 kg   LMP 12/19/2020 (Exact Date)   SpO2 99%   BMI 37.89 kg/m  Total I/O In: 497.9 [P.O.:120; I.V.:377.9] Out: 175 [Urine:175]  She has shown cervical change. CERVIX: 4.5 cm:  90%:   -2:   mid position:   soft SVE:   Dilation: 4.5 Effacement (%): 90 Station: -2 Exam by:: A Margeaux Swantek CNM CONTRACTIONS: regular, every 1-3 minutes FHR: Fetal heart tracing reviewed. Baseline: 125 bpm, Variability: Good {> 6 bpm), Accelerations: Non-reactive but appropriate for gestational age, and Decelerations: Early Category II     Denies epigastric pain, visual changes, has mild headache,  Lungs clear bilaterally Reflexes 2+ bilaterally Negative clonus Minimal swelling noted below knees.   Labs: Lab Results  Component Value Date   WBC 8.4 09/08/2021   HGB 12.9 09/08/2021   HCT 40.5 09/08/2021   MCV 83.7 09/08/2021   PLT 239 09/08/2021     ASSESSMENT: 1) Labor curve reviewed.       Progress: Prolonged latent labor.     Membranes: ruptured, clear fluid     IUPC placed      Principal Problem:   Pregnancy Active Problems:   Elevated blood pressure affecting pregnancy in third trimester, antepartum   Pre-eclampsia in third trimester   PLAN: continue present management Epidural prn  Doreene Burke, CNM  09/09/2021 9:26 AM

## 2021-09-09 NOTE — Progress Notes (Signed)
LABOR NOTE   Kristina Kim 30 y.o.GP@ at [redacted]w[redacted]d  SUBJECTIVE:  Pt comfortable with epidural Analgesia: Epidural Called to room by RN @1008  for prolonged decels and repetitive late decelerations after epidural placement. Tachysystole noted with ctx q 1 minute. Pt  position changed, IV fluid bolus, FSE placed. 1 Dose of terbutaline given ,as well as turning off the pitocin.    OBJECTIVE:  BP (!) 145/67   Pulse 92   Temp 98.7 F (37.1 C) (Oral)   Resp 18   Ht 5' (1.524 m)   Wt 88 kg   LMP 12/19/2020 (Exact Date)   SpO2 98%   BMI 37.89 kg/m  Total I/O In: 1225 [P.O.:120; I.V.:1098.3; Other:6.7] Out: 275 [Urine:275]  She has not shown cervical change. CERVIX: 4 cm:  90%:   -2:   mid position:   firm SVE:   Dilation: 4 Effacement (%): 90 Station: -2 Exam by:: [email or web address] RN CONTRACTIONS: regular, every 4 minutes ( contractions decreased after terbutaline dose and cessation of pitocin) FHR: Fetal heart tracing reviewed. Baseline: 135 bpm, Variability: Fair (1-6 bpm), Accelerations: Non-reactive but appropriate for gestational age, and Decelerations: Late Category II    Labs: Lab Results  Component Value Date   WBC 8.4 09/08/2021   HGB 12.9 09/08/2021   HCT 40.5 09/08/2021   MCV 83.7 09/08/2021   PLT 239 09/08/2021    ASSESSMENT: 1) Labor curve reviewed.       Progress: Prolonged latent labor.     Membranes: ruptured, clear fluid     IUPC     FSE placed       Principal Problem:   Pregnancy Active Problems:   Elevated blood pressure affecting pregnancy in third trimester, antepartum   Pre-eclampsia in third trimester   PLAN:   Consulted with Dr. 09/10/2021, discussed re starting pitocin if late decelerations resolve. He has reviewed the strip and is in agreement with plan of care.   Discussed tracing with patient and her partner. Discussed that if decelerations continue that it is not recommended to continue with pitocin and that cesarean section would be indicated. If  late decelerations resolve the plan is to restart the pitocin and continue with induction. They are in agreement.   Logan Bores, CNM  09/09/2021 11:06 AM

## 2021-09-09 NOTE — Progress Notes (Signed)
LABOR NOTE   Kristina Kim 30 y.o.GP@ at [redacted]w[redacted]d  SUBJECTIVE:  Feeling pressure Analgesia: Epidural  OBJECTIVE:  BP 111/70   Pulse 89   Temp 98 F (36.7 C) (Oral)   Resp 18   Ht 5' (1.524 m)   Wt 88 kg   LMP 12/19/2020 (Exact Date)   SpO2 98%   BMI 37.89 kg/m  Total I/O In: 2417.8 [P.O.:240; I.V.:2141.1; Other:36.7] Out: 420 [Urine:420]  She has shown cervical change. CERVIX: 6 cm:  100%:   -2:   mid position:   soft SVE:   Dilation: 6 Effacement (%): 100 Station: -2 Exam by:: A Trishia Cuthrell CNM CONTRACTIONS: regular, every 1-3 minutes FHR: Fetal heart tracing reviewed. Baseline: 115 bpm, Variability: Fair (1-6 bpm), Accelerations: Non-reactive but appropriate for gestational age, and Decelerations: Absent Category II    Labs: Lab Results  Component Value Date   WBC 8.4 09/08/2021   HGB 12.9 09/08/2021   HCT 40.5 09/08/2021   MCV 83.7 09/08/2021   PLT 239 09/08/2021    ASSESSMENT: 1) Labor curve reviewed.       Progress: Active phase labor.     Membranes: ruptured, clear fluid           Principal Problem:   Pregnancy Active Problems:   Elevated blood pressure affecting pregnancy in third trimester, antepartum   Pre-eclampsia in third trimester   PLAN: Dr. Clayburn Pert is aware of progress and fetal heart rate tracing.  Continue current plan  Kristina Kim, CNM  09/09/2021 3:15 PM

## 2021-09-09 NOTE — Progress Notes (Signed)
CNM to recheck patient in an hour- MD and CNM aware of tracing.  Handoff given.

## 2021-09-09 NOTE — Progress Notes (Signed)
LABOR NOTE   Kristina Kim 30 y.o.GP@ at [redacted]w[redacted]d  SUBJECTIVE:  Feeling pressure with contractions. Analgesia: Epidural  OBJECTIVE:  BP (!) 152/86   Pulse 94   Temp 98 F (36.7 C)   Resp 18   Ht 5' (1.524 m)   Wt 88 kg   LMP 12/19/2020 (Exact Date)   SpO2 99%   BMI 37.89 kg/m  Total I/O In: 130.4 [I.V.:130.4] Out: 30 [Urine:30]  She has shown cervical change. CERVIX: 10 cm:  100%:   0:   mid position:    SVE:   Dilation: 10 Effacement (%): 100 Station: 0 Exam by:: Janee Morn, CNM CONTRACTIONS: regular, every 3-4 minutes FHR: Fetal heart tracing reviewed. Baseline: 130 bpm, Variability: Fair (1-6 bpm), Accelerations: Non-reactive but appropriate for gestational age, and Decelerations: variable with pushing  Category II    Labs: Lab Results  Component Value Date   WBC 13.8 (H) 09/09/2021   HGB 14.4 09/09/2021   HCT 46.6 (H) 09/09/2021   MCV 86.6 09/09/2021   PLT 300 09/09/2021    ASSESSMENT: 1) Labor curve reviewed.       Progress: Active phase labor.     Membranes: ruptured, clear fluid           Principal Problem:   Pregnancy Active Problems:   Elevated blood pressure affecting pregnancy in third trimester, antepartum   Pre-eclampsia in third trimester   PLAN: Dr. Valentino Saxon at the bedside 2052, resume pushing for further evaluation ( see her note). Pushed x approximately 30 min with return of late decelerations. Discussed with patient and family concerns and recommendation to proceed with cesarean section. Patient and her family in agreement to plan. Consents signed , all questions answered.  Anesthesia notified.   Doreene Burke, CNM  09/09/2021 9:50 PM

## 2021-09-09 NOTE — Progress Notes (Signed)
LABOR NOTE   Kristina Kim 30 y.o.GP@ at [redacted]w[redacted]d  SUBJECTIVE:  Pt c/o increased rectal pressure Analgesia: Epidural  OBJECTIVE:  BP (!) 152/86   Pulse 94   Temp 98 F (36.7 C)   Resp 18   Ht 5' (1.524 m)   Wt 88 kg   LMP 12/19/2020 (Exact Date)   SpO2 99%   BMI 37.89 kg/m  No intake/output data recorded.  She has shown cervical change. CERVIX: 9.5 cm:  100%:   0:   mid position:   soft SVE:   Dilation: 9 Effacement (%): 100 Station: 0 Exam by:: A Judithe Keetch MD CONTRACTIONS: regular, every 1-3 minutes FHR: Fetal heart tracing reviewed. Baseline: 125 bpm, Variability: Fair (1-6 bpm), Accelerations: Non-reactive but appropriate for gestational age, and Decelerations: Absent Category II     Labs: Lab Results  Component Value Date   WBC 13.8 (H) 09/09/2021   HGB 14.4 09/09/2021   HCT 46.6 (H) 09/09/2021   MCV 86.6 09/09/2021   PLT 300 09/09/2021    ASSESSMENT: 1) Labor curve reviewed.       Progress: Active phase labor.     Membranes: ruptured, clear fluid     IUPC & FSE in place        Principal Problem:   Pregnancy Active Problems:   Elevated blood pressure affecting pregnancy in third trimester, antepartum   Pre-eclampsia in third trimester   PLAN: Started pushing at 1936 to reduce cervix. Cervix reduced . After pushing for approximately 20 minutes, fetal heart rate deceleration noted.   Late decelerations were occurring repetitively down to 60-90 bpm despite position changes with pushing. Pitocin stopped , Dr. Valentino Saxon notified. Will continue to monitor. Dr. Valentino Saxon to come in and evaluate for assistance with vacuum.   Doreene Burke, CNM  09/09/2021 8:29 PM

## 2021-09-10 ENCOUNTER — Encounter: Payer: Self-pay | Admitting: Obstetrics

## 2021-09-10 LAB — CBC
HCT: 36.4 % (ref 36.0–46.0)
Hemoglobin: 11.7 g/dL — ABNORMAL LOW (ref 12.0–15.0)
MCH: 27.1 pg (ref 26.0–34.0)
MCHC: 32.1 g/dL (ref 30.0–36.0)
MCV: 84.3 fL (ref 80.0–100.0)
Platelets: 283 10*3/uL (ref 150–400)
RBC: 4.32 MIL/uL (ref 3.87–5.11)
RDW: 15.4 % (ref 11.5–15.5)
WBC: 14.5 10*3/uL — ABNORMAL HIGH (ref 4.0–10.5)
nRBC: 0 % (ref 0.0–0.2)

## 2021-09-10 LAB — COMPREHENSIVE METABOLIC PANEL
ALT: 22 U/L (ref 0–44)
AST: 28 U/L (ref 15–41)
Albumin: 2.6 g/dL — ABNORMAL LOW (ref 3.5–5.0)
Alkaline Phosphatase: 153 U/L — ABNORMAL HIGH (ref 38–126)
Anion gap: 8 (ref 5–15)
BUN: <5 mg/dL — ABNORMAL LOW (ref 6–20)
CO2: 20 mmol/L — ABNORMAL LOW (ref 22–32)
Calcium: 7.7 mg/dL — ABNORMAL LOW (ref 8.9–10.3)
Chloride: 107 mmol/L (ref 98–111)
Creatinine, Ser: 0.45 mg/dL (ref 0.44–1.00)
GFR, Estimated: >60 mL/min (ref >60)
Glucose, Bld: 132 mg/dL — ABNORMAL HIGH (ref 70–99)
Potassium: 3.1 mmol/L — ABNORMAL LOW (ref 3.5–5.1)
Sodium: 135 mmol/L (ref 135–145)
Total Bilirubin: 0.6 mg/dL (ref 0.3–1.2)
Total Protein: 5.9 g/dL — ABNORMAL LOW (ref 6.5–8.1)

## 2021-09-10 MED ORDER — DIPHENHYDRAMINE HCL 50 MG/ML IJ SOLN: 12.5000 mg | INTRAMUSCULAR | Status: DC | PRN

## 2021-09-10 MED ORDER — DIBUCAINE (PERIANAL) 1 % EX OINT: 1.0000 | TOPICAL_OINTMENT | CUTANEOUS | Status: DC | PRN

## 2021-09-10 MED ORDER — NALOXONE HCL 0.4 MG/ML IJ SOLN: 0.4000 mg | INTRAMUSCULAR | Status: DC | PRN

## 2021-09-10 MED ORDER — COCONUT OIL OIL: 1.0000 | TOPICAL_OIL | Status: DC | PRN

## 2021-09-10 MED ORDER — HYDROMORPHONE HCL 1 MG/ML IJ SOLN: 1.0000 mg | INTRAMUSCULAR | Status: DC | PRN

## 2021-09-10 MED ORDER — KETOROLAC TROMETHAMINE 30 MG/ML IJ SOLN
30.0000 mg | Freq: Four times a day (QID) | INTRAMUSCULAR | Status: AC | PRN
  Administered 2021-09-10 (×2): 30 mg via INTRAVENOUS
  Filled 2021-09-10 (×3): qty 1

## 2021-09-10 MED ORDER — SIMETHICONE 80 MG PO CHEW
80.0000 mg | CHEWABLE_TABLET | ORAL | Status: DC | PRN
  Administered 2021-09-11: 80 mg via ORAL
  Filled 2021-09-10 (×2): qty 1

## 2021-09-10 MED ORDER — MEPERIDINE HCL 25 MG/ML IJ SOLN: 6.2500 mg | INTRAMUSCULAR | Status: DC | PRN

## 2021-09-10 MED ORDER — TRAMADOL HCL 50 MG PO TABS: 50.0000 mg | ORAL_TABLET | Freq: Four times a day (QID) | ORAL | Status: DC | PRN

## 2021-09-10 MED ORDER — MENTHOL 3 MG MT LOZG: 1.0000 | LOZENGE | OROMUCOSAL | Status: DC | PRN

## 2021-09-10 MED ORDER — DIPHENHYDRAMINE HCL 25 MG PO CAPS: 25.0000 mg | ORAL_CAPSULE | Freq: Four times a day (QID) | ORAL | Status: DC | PRN

## 2021-09-10 MED ORDER — WITCH HAZEL-GLYCERIN EX PADS: 1.0000 | MEDICATED_PAD | CUTANEOUS | Status: DC | PRN

## 2021-09-10 MED ORDER — GABAPENTIN 300 MG PO CAPS
300.0000 mg | ORAL_CAPSULE | Freq: Two times a day (BID) | ORAL | Status: DC
  Administered 2021-09-10 – 2021-09-13 (×7): 300 mg via ORAL
  Filled 2021-09-10 (×7): qty 1

## 2021-09-10 MED ORDER — KETOROLAC TROMETHAMINE 30 MG/ML IJ SOLN
30.0000 mg | Freq: Four times a day (QID) | INTRAMUSCULAR | Status: AC
  Administered 2021-09-10 – 2021-09-11 (×4): 30 mg via INTRAVENOUS
  Filled 2021-09-10 (×3): qty 1

## 2021-09-10 MED ORDER — VARICELLA VIRUS VACCINE LIVE 1350 PFU/0.5ML IJ SUSR
0.5000 mL | INTRAMUSCULAR | Status: DC | PRN
  Filled 2021-09-10: qty 0.5

## 2021-09-10 MED ORDER — OXYCODONE HCL 5 MG PO TABS: 5.0000 mg | ORAL_TABLET | ORAL | Status: DC | PRN

## 2021-09-10 MED ORDER — FERROUS SULFATE 325 (65 FE) MG PO TABS
325.0000 mg | ORAL_TABLET | ORAL | Status: DC
  Administered 2021-09-11 – 2021-09-13 (×2): 325 mg via ORAL
  Filled 2021-09-10 (×2): qty 1

## 2021-09-10 MED ORDER — MAGNESIUM HYDROXIDE 400 MG/5ML PO SUSP: 30.0000 mL | ORAL | Status: DC | PRN

## 2021-09-10 MED ORDER — IBUPROFEN 600 MG PO TABS
600.0000 mg | ORAL_TABLET | Freq: Four times a day (QID) | ORAL | Status: DC
  Administered 2021-09-11 – 2021-09-13 (×9): 600 mg via ORAL
  Filled 2021-09-10 (×9): qty 1

## 2021-09-10 MED ORDER — LABETALOL HCL 200 MG PO TABS
200.0000 mg | ORAL_TABLET | Freq: Two times a day (BID) | ORAL | Status: DC
  Administered 2021-09-10 – 2021-09-11 (×2): 200 mg via ORAL
  Filled 2021-09-10: qty 1
  Filled 2021-09-10: qty 2

## 2021-09-10 MED ORDER — OXYTOCIN-SODIUM CHLORIDE 30-0.9 UT/500ML-% IV SOLN: 2.5000 [IU]/h | INTRAVENOUS | Status: AC

## 2021-09-10 MED ORDER — ONDANSETRON HCL 4 MG/2ML IJ SOLN
4.0000 mg | Freq: Three times a day (TID) | INTRAMUSCULAR | Status: DC | PRN
  Administered 2021-09-10: 4 mg via INTRAVENOUS
  Filled 2021-09-10: qty 2

## 2021-09-10 MED ORDER — LACTATED RINGERS IV SOLN: INTRAVENOUS | Status: DC

## 2021-09-10 MED ORDER — SODIUM CHLORIDE 0.9% FLUSH: 3.0000 mL | INTRAVENOUS | Status: DC | PRN

## 2021-09-10 MED ORDER — ACETAMINOPHEN 500 MG PO TABS
1000.0000 mg | ORAL_TABLET | Freq: Four times a day (QID) | ORAL | Status: AC
  Administered 2021-09-10 (×4): 1000 mg via ORAL
  Filled 2021-09-10 (×4): qty 2

## 2021-09-10 MED ORDER — PRENATAL MULTIVITAMIN CH
1.0000 | ORAL_TABLET | Freq: Every day | ORAL | Status: DC
  Administered 2021-09-10 – 2021-09-13 (×4): 1 via ORAL
  Filled 2021-09-10 (×4): qty 1

## 2021-09-10 MED ORDER — SENNOSIDES-DOCUSATE SODIUM 8.6-50 MG PO TABS
2.0000 | ORAL_TABLET | Freq: Every day | ORAL | Status: DC
Start: 2021-09-11 — End: 2021-09-13
  Administered 2021-09-11 – 2021-09-13 (×2): 2 via ORAL
  Filled 2021-09-10 (×5): qty 2

## 2021-09-10 MED ORDER — ZOLPIDEM TARTRATE 5 MG PO TABS: 5.0000 mg | ORAL_TABLET | Freq: Every evening | ORAL | Status: DC | PRN

## 2021-09-10 MED ORDER — NIFEDIPINE ER OSMOTIC RELEASE 30 MG PO TB24
60.0000 mg | ORAL_TABLET | Freq: Every day | ORAL | Status: DC
  Administered 2021-09-11 – 2021-09-13 (×3): 60 mg via ORAL
  Filled 2021-09-10 (×3): qty 2

## 2021-09-10 MED ORDER — ONDANSETRON HCL 4 MG/2ML IJ SOLN
4.0000 mg | Freq: Once | INTRAMUSCULAR | Status: DC | PRN
Start: 2021-09-10 — End: 2021-09-10

## 2021-09-10 MED ORDER — DIPHENHYDRAMINE HCL 25 MG PO CAPS: 25.0000 mg | ORAL_CAPSULE | ORAL | Status: DC | PRN

## 2021-09-10 MED ORDER — FENTANYL CITRATE (PF) 100 MCG/2ML IJ SOLN
25.0000 ug | INTRAMUSCULAR | Status: DC | PRN
  Administered 2021-09-10 (×3): 25 ug via INTRAVENOUS
  Filled 2021-09-10: qty 2

## 2021-09-10 MED ORDER — ACETAMINOPHEN 500 MG PO TABS
1000.0000 mg | ORAL_TABLET | Freq: Four times a day (QID) | ORAL | Status: DC
  Administered 2021-09-11 – 2021-09-13 (×10): 1000 mg via ORAL
  Filled 2021-09-10 (×11): qty 2

## 2021-09-10 MED ORDER — NALOXONE HCL 4 MG/10ML IJ SOLN: 1.0000 ug/kg/h | INTRAVENOUS | Status: DC | PRN

## 2021-09-10 MED ORDER — KETOROLAC TROMETHAMINE 30 MG/ML IJ SOLN: 30.0000 mg | Freq: Four times a day (QID) | INTRAMUSCULAR | Status: AC | PRN

## 2021-09-10 NOTE — Transfer of Care (Signed)
Immediate Anesthesia Transfer of Care Note  Patient: Kristina Kim  Procedure(s) Performed: CESAREAN SECTION  Patient Location: PACU  Anesthesia Type:Epidural  Level of Consciousness: awake, alert  and oriented  Airway & Oxygen Therapy: Patient Spontanous Breathing  Post-op Assessment: Report given to RN and Post -op Vital signs reviewed and stable  Post vital signs: Reviewed and stable  Last Vitals:  Vitals Value Taken Time  BP    Temp    Pulse    Resp    SpO2      Last Pain:  Vitals:   09/09/21 1730  TempSrc:   PainSc: 0-No pain         Complications: No notable events documented.

## 2021-09-10 NOTE — Progress Notes (Addendum)
Postpartum Day # 1: Cesarean Delivery (primary).  Pre-eclampsia with severe features.   Subjective: Patient reports tolerating PO.  Reports pain is well controlled. Denies headaches, blurred vision, RUQ pain. Not yet ambulating. Attempting to breastfeed.   Objective: Vital signs in last 24 hours: Temp:  [97.6 F (36.4 C)-98.3 F (36.8 C)] 98.1 F (36.7 C) (08/30 0730) Pulse Rate:  [84-103] 84 (08/30 0730) Resp:  [15-24] 16 (08/30 0730) BP: (111-174)/(64-97) 139/82 (08/30 0730) SpO2:  [89 %-100 %] 96 % (08/30 0730)    Intake/Output Summary (Last 24 hours) at 09/10/2021 0807 Last data filed at 09/10/2021 0748 Gross per 24 hour  Intake 5769.33 ml  Output 6548 ml  Net -778.67 ml    Physical Exam:  General: alert and no distress Lungs: clear to auscultation bilaterally Breasts: normal appearance, no masses or tenderness Heart: regular rate and rhythm, S1, S2 normal, no murmur, click, rub or gallop Abdomen: soft, non-tender; bowel sounds normal; no masses,  no organomegaly Pelvis: Lochia appropriate, Uterine Fundus firm, Incision: dressing clean/dry/intact Extremities: DVT Evaluation: No evidence of DVT seen on physical exam. Negative Homan's sign. No cords or calf tenderness. Trace pitting edema.  Neurologic: + DTRs in upper and lower extremities, no clonus.   Recent Labs    09/08/21 0926 09/09/21 1544  HGB 12.9 14.4  HCT 40.5 46.6*    Assessment/Plan: Status post Cesarean section. Doing well postoperatively.  Breastfeeding and Lactation consult Regular diet Continue PO pain management Remove foley catheter once Magnesium Sulfate discontinued Continue Magnesium Sulfate for 24 hours postpartum (ends at 11:30 pm). Procardia for BP management.  Strict I/O's.  Varicella non-immune, for vaccination postpartum.  Desires circumcision for female infant.  Contraception, considering Nexplanon.  Continue current care.  Hildred Laser, MD [facility]

## 2021-09-11 MED ORDER — FUROSEMIDE 20 MG PO TABS
20.0000 mg | ORAL_TABLET | Freq: Once | ORAL | Status: AC
  Administered 2021-09-11: 20 mg via ORAL
  Filled 2021-09-11: qty 1

## 2021-09-11 MED ORDER — LABETALOL HCL 200 MG PO TABS
300.0000 mg | ORAL_TABLET | Freq: Two times a day (BID) | ORAL | Status: DC
  Administered 2021-09-11 – 2021-09-13 (×4): 300 mg via ORAL
  Filled 2021-09-11 (×4): qty 1

## 2021-09-11 NOTE — Anesthesia Postprocedure Evaluation (Signed)
Anesthesia Post Note  Patient: Kristina Kim  Procedure(s) Performed: CESAREAN SECTION  Patient location during evaluation: Mother Baby Anesthesia Type: Epidural Level of consciousness: awake Pain management: pain level controlled Postop Assessment: no headache Anesthetic complications: no   No notable events documented.   Last Vitals:  Vitals:   09/11/21 0035 09/11/21 0432  BP: (!) 141/71 (!) 155/88  Pulse: 94 100  Resp: 18 18  Temp: 37.5 C 36.7 C  SpO2: 95% 93%    Last Pain:  Vitals:   09/11/21 0045  TempSrc:   PainSc: 0-No pain                 Jaye Beagle

## 2021-09-11 NOTE — Progress Notes (Signed)
Obstetric Postpartum Daily Progress Note Subjective:  31 y.o. G1P1001 postpartum day #2 status primary LTCS .  She is ambulating, is tolerating po, is voiding spontaneously. Passing flatus, has not had a BM.  Her pain is well controlled on PO pain medications. Her lochia is less than menses. Breastfeeding is a work in progress, has seen LC. Infant was given bottle over night.    Medications SCHEDULED MEDICATIONS   acetaminophen  1,000 mg Oral Q6H   ferrous sulfate  325 mg Oral QODAY   gabapentin  300 mg Oral BID   ibuprofen  600 mg Oral Q6H   labetalol  200 mg Oral BID   NIFEdipine  60 mg Oral Daily   prenatal multivitamin  1 tablet Oral Q1200   senna-docusate  2 tablet Oral Daily    MEDICATION INFUSIONS   lactated ringers     lactated ringers Stopped (09/10/21 2305)   magnesium sulfate Stopped (09/10/21 2305)   naloxone HCl (NARCAN) 2 mg in dextrose 5 % 250 mL infusion     oxytocin      PRN MEDICATIONS  coconut oil, witch hazel-glycerin **AND** dibucaine, diphenhydrAMINE **OR** diphenhydrAMINE, diphenhydrAMINE, labetalol **AND** labetalol **AND** labetalol **AND** hydrALAZINE **AND** Measure blood pressure, HYDROmorphone (DILAUDID) injection, magnesium hydroxide, menthol-cetylpyridinium, naloxone **AND** sodium chloride flush, naloxone HCl (NARCAN) 2 mg in dextrose 5 % 250 mL infusion, ondansetron (ZOFRAN) IV, oxyCODONE, simethicone, traMADol, varicella virus vaccine live, zolpidem    Objective:   Vitals:   09/11/21 0000 09/11/21 0035 09/11/21 0432 09/11/21 0901  BP: (!) 141/78 (!) 141/71 (!) 155/88 (!) 157/81  Pulse: (!) 101 94 100 96  Resp: 18 18 18 20   Temp:  99.5 F (37.5 C) 98 F (36.7 C) 98.2 F (36.8 C)  TempSrc:  Oral  Oral  SpO2: 92% 95% 93% 99%  Weight:      Height:        Current Vital Signs 24h Vital Sign Ranges  T 98.2 F (36.8 C) Temp  Avg: 98.7 F (37.1 C)  Min: 98 F (36.7 C)  Max: 99.7 F (37.6 C)  BP (!) 157/81 BP  Min: 130/108  Max: 166/85  HR 96  Pulse  Avg: 99  Min: 86  Max: 110  RR 20 Resp  Avg: 17.8  Min: 14  Max: 20  SaO2 99 %   SpO2  Avg: 96.6 %  Min: 87 %  Max: 100 %       24 Hour I/O Current Shift I/O  Time Ins Outs 08/30 0701 - 08/31 0700 In: 3407.3 [P.O.:1530; I.V.:1838.7] Out: 7275 [Urine:7275] No intake/output data recorded.  General: NAD Breasts: left breast soft, no masses, nipple erect and intact, Right breast not assessed as this CNM was assisting with latch on Left breast. Few sucks noted without breast shield. Pt first attempted latch with a NS, infant sleepy, removed shield then latched infant.  Pulmonary: no increased work of breathing, CTAB Heart: RRR< no murmurs Abdomen: non-distended, non-tender, fundus firm at level of umbilicus, BS presents Incision: honeycomb dressing dry and intact  Extremities: no edema, no erythema, no tenderness  Labs:  Recent Labs  Lab 09/08/21 0926 09/09/21 1544 09/10/21 0843  WBC 8.4 13.8* 14.5*  HGB 12.9 14.4 11.7*  HCT 40.5 46.6* 36.4  PLT 239 300 283     Assessment:   31 y.o. G1P1001 postpartum day # 2 status post Primary LTCS  Plan:   1) Acute blood loss anemia - hemodynamically stable and asymptomatic - po ferrous sulfate  2) B POS Performed at [facility], [address] [place], [place] [address]  / Ishmael Holter 3.50 (03/08 1040)/ Varicella Not immune  3) TDAP status up to date  4) breast/bottle /Contraception =  Nexplanon  5) Disposition Possible Discharge later today   Ellouise Newer [facility], CNM  09/11/2021 10:03 AM

## 2021-09-12 MED ORDER — VARICELLA VIRUS VACCINE LIVE 1350 PFU/0.5ML IJ SUSR: 0.5000 mL | Freq: Once | INTRAMUSCULAR | Status: DC

## 2021-09-12 NOTE — Progress Notes (Signed)
PROGRESS NOTE  [name]'s BPs have mostly remained elevated.  She denies HA, visual changes, and epigastric pain. Baby is now staying overnight for bililights. Kristina Kim and her partner are in agreement with the plan to remain inpatient overnight to monitor blood pressures.  BP (!) 152/85 (BP Location: Left Arm)   Pulse 89   Temp 98.4 F (36.9 C) (Oral)   Resp 18   Ht 5' (1.524 m)   Wt 88 kg   LMP 12/19/2020 (Exact Date)   SpO2 100%   Breastfeeding Unknown   BMI 37.89 kg/m    Guadlupe Spanish, CNM

## 2021-09-12 NOTE — Progress Notes (Signed)
Post Partum Day 2 Subjective: Kristina Kim is feeling well. Her pain is well-controlled. She is ambulating and voiding without difficulty and has had a bowel movement. Her bleeding is minimal. Her blood pressures have remained in the 150s/90s. She denies HA, visual changes, and epigastric/RUQ pain. She strongly desires discharge today.  Objective: Blood pressure (!) 158/97, pulse 99, temperature 98 F (36.7 C), temperature source Oral, resp. rate 20, height 5' (1.524 m), weight 88 kg, last menstrual period 12/19/2020, SpO2 99 %, unknown if currently breastfeeding.  Physical Exam:  General: alert, cooperative, and appears stated age Lochia: appropriate Uterine Fundus: firm Incision: healing well, no significant drainage DVT Evaluation: No evidence of DVT seen on physical exam.  Recent Labs    09/09/21 1544 09/10/21 0843  HGB 14.4 11.7*  HCT 46.6* 36.4    Assessment/Plan: I recommended that Kristina Kim remain in the hospital overnight to continue to monitor her blood pressures. I reviewed the potential risks associated with elevated BPs in the PP period. Kristina Kim is strongly opposed to this recommendation and prefers to be discharged home. She did agree to have her blood pressures monitored through the afternoon and then reassess the plan.   LOS: 4 days   Glenetta Borg, CNM 09/12/2021, 10:22 AM

## 2021-09-12 NOTE — Lactation Note (Signed)
This note was copied from a baby's chart. Lactation Consultation Note  Patient Name: Kristina Kim [id number] Date: 09/12/2021 Reason for consult: Initial assessment;Mother's request;Early term 37-38.6wks;Other (Comment) (Mom requested assisting with pumping. She began primarily bottle feeding since yesterday afternoon.) Age:30 hours  Maternal Data This is mom's first baby, primary C/S for fetal heart rate indication. Mom's initial goal was breastfeeding and formula feeding by choice. Yesterday afternoon mom began bottle feeding without offering the baby to breast. Per mom she is interested in pumping and providing her baby with breastmilk. She is still considering offering the baby to breastfeed in addition to bottle feeding For now she would like to pump. Care nurse initiated breastpumping.  Has patient been taught Hand Expression?: Yes Does the patient have breastfeeding experience prior to this delivery?: No  Feeding Mother's Current Feeding Choice: Breast Milk and Formula   Lactation Tools Discussed/Used  DEBP, manual breast pump.  Interventions Interventions: Hand express;Hand pump;Education;DEBP (Care nurse assisted mom with initiating DEBP)  Discharge Discharge Education: Engorgement and breast care;Warning signs for feeding baby;Other (comment) (Mom will bring baby for follow-up at [facility] clinic. Mom is aware she can call [facility] for outpatient lactation assistance once she goes home if she needs additional help.) Pump: Manual;Advised to call insurance company (Mom will call her insurance company to request an electric breastpump for home.)  Consult Status Consult Status: PRN  Update provided to care nurse.  Fuller Song 09/12/2021, 12:49 PM

## 2021-09-13 MED ORDER — NIFEDIPINE ER 60 MG PO TB24: 60.0000 mg | ORAL_TABLET | Freq: Every day | ORAL | 1 refills | Status: DC

## 2021-09-13 MED ORDER — IBUPROFEN 600 MG PO TABS: 600.0000 mg | ORAL_TABLET | Freq: Four times a day (QID) | ORAL | 0 refills | Status: DC

## 2021-09-13 MED ORDER — LABETALOL HCL 300 MG PO TABS: 300.0000 mg | ORAL_TABLET | Freq: Two times a day (BID) | ORAL | 1 refills | Status: DC

## 2021-09-13 MED ORDER — OXYCODONE HCL 5 MG PO TABS: 5.0000 mg | ORAL_TABLET | ORAL | 0 refills | Status: DC | PRN

## 2021-09-13 NOTE — Discharge Summary (Signed)
Postpartum Discharge Summary  Date of Service updated 09/13/2021     Patient Name: Kristina Kim DOB: Sep 25, 1990 MRN: [id number]  Date of admission: 09/08/2021 Delivery date:09/09/2021  Delivering provider: Rubie Maid  Date of discharge: 09/13/2021  Admitting diagnosis: Pregnancy [Z34.90] Elevated blood pressure affecting pregnancy in third trimester, antepartum [O16.3] Pre-eclampsia in third trimester [O14.93] Intrauterine pregnancy: [redacted]w[redacted]d    Secondary diagnosis:  Principal Problem:   Pregnancy Active Problems:   Elevated blood pressure affecting pregnancy in third trimester, antepartum   Pre-eclampsia in third trimester   Postpartum care following cesarean delivery  Additional problems: none    Discharge diagnosis: Term Pregnancy Delivered and Preeclampsia (severe)                                              Post partum procedures: NA Augmentation: AROM, Pitocin, Cytotec, and IP Foley Complications: None  Hospital course: Induction of Labor With Cesarean Section   31y.o. yo G1P1001 at 323w5das admitted to the hospital 09/08/2021 for induction of labor. Patient had a labor course significant for severe range blood pressures, a dx of preeclampsia and fetal intolerance of labor in second stage. The patient went for cesarean section due to Non-Reassuring FHR. Delivery details are as follows: Membrane Rupture Time/Date: 9:16 AM ,09/09/2021   Delivery Method:C-Section, Low Transverse  Details of operation can be found in separate operative Note.  Patient had an uncomplicated postpartum course. She is ambulating, tolerating a regular diet, passing flatus, and urinating well.  Patient is discharged home in stable condition on 09/13/21.      Newborn Data: Birth date:09/09/2021  Birth time:10:51 PM  Gender:Female  Living status:Living  Apgars:1 ,8  Weight:2870 g                                Magnesium Sulfate received: Yes: Seizure prophylaxis BMZ received:  No Rhophylac:N/A MMR:No T-DaP:Given prenatally Flu: No Transfusion:No  Physical exam  Vitals:   09/12/21 2027 09/13/21 0026 09/13/21 0443 09/13/21 0810  BP: (!) 158/93 134/79 (!) 141/81 (!) 142/88  Pulse: 100 88 92 98  Resp: 20 20 18 18   Temp: 99.1 F (37.3 C) 98.1 F (36.7 C) 98 F (36.7 C) 98.9 F (37.2 C)  TempSrc: Oral Oral Oral Oral  SpO2: 99% 95% 95% 99%  Weight:      Height:       General: alert, cooperative, and no distress Lochia: appropriate Uterine Fundus: firm Incision: Healing well with no significant drainage, No significant erythema, Dressing is clean, dry, and intact DVT Evaluation: No evidence of DVT seen on physical exam. Negative Homan's sign. No cords or calf tenderness. Labs: Lab Results  Component Value Date   WBC 14.5 (H) 09/10/2021   HGB 11.7 (L) 09/10/2021   HCT 36.4 09/10/2021   MCV 84.3 09/10/2021   PLT 283 09/10/2021      Latest Ref Rng & Units 09/10/2021    8:43 AM  CMP  Glucose 70 - 99 mg/dL 132   BUN 6 - 20 mg/dL <5   Creatinine 0.44 - 1.00 mg/dL 0.45   Sodium 135 - 145 mmol/L 135   Potassium 3.5 - 5.1 mmol/L 3.1   Chloride 98 - 111 mmol/L 107   CO2 22 - 32 mmol/L 20   Calcium 8.9 -  10.3 mg/dL 7.7   Total Protein 6.5 - 8.1 g/dL 5.9   Total Bilirubin 0.3 - 1.2 mg/dL 0.6   Alkaline Phos 38 - 126 U/L 153   AST 15 - 41 U/L 28   ALT 0 - 44 U/L 22    Edinburgh Score:    09/11/2021    9:40 AM  Edinburgh Postnatal Depression Scale Screening Tool  I have been able to laugh and see the funny side of things. 0  I have looked forward with enjoyment to things. 0  I have blamed myself unnecessarily when things went wrong. 1  I have been anxious or worried for no good reason. 0  I have felt scared or panicky for no good reason. 0  Things have been getting on top of me. 0  I have been so unhappy that I have had difficulty sleeping. 0  I have felt sad or miserable. 0  I have been so unhappy that I have been crying. 0  The thought of  harming myself has occurred to me. 0  Edinburgh Postnatal Depression Scale Total 1      After visit meds:  Allergies as of 09/13/2021   No Known Allergies      Medication List     STOP taking these medications    aspirin EC 81 MG tablet       TAKE these medications    ibuprofen 600 MG tablet Commonly known as: ADVIL Take 1 tablet (600 mg total) by mouth every 6 (six) hours.   labetalol 300 MG tablet Commonly known as: NORMODYNE Take 1 tablet (300 mg total) by mouth 2 (two) times daily.   NIFEdipine 60 MG 24 hr tablet Commonly known as: ADALAT CC Take 1 tablet (60 mg total) by mouth daily. Start taking on: September 14, 2021   oxyCODONE 5 MG immediate release tablet Commonly known as: Oxy IR/ROXICODONE Take 1-2 tablets (5-10 mg total) by mouth every 4 (four) hours as needed for moderate pain or severe pain.   Prenatal Vitamins 28-0.8 MG Tabs Take 1 capsule by mouth daily.               Discharge Care Instructions  (From admission, onward)           Start     Ordered   09/13/21 0000  Leave dressing on - Keep it clean, dry, and intact until clinic visit        09/13/21 1205             Discharge home in stable condition Infant Feeding: Breast Infant Disposition:home with mother Discharge instruction: per After Visit Summary and Postpartum booklet. Activity: Advance as tolerated. Pelvic rest for 6 weeks.  Diet: routine diet Anticipated Birth Control: IUD possibly Postpartum Appointment:1 week and in 6 weeks Additional Postpartum F/U: Postpartum Depression checkup Future Appointments:No future appointments. Follow up Visit:  [place] Follow up in 1 week(s).   Why: Please call the [place] office on tuesday and set upan incison checkand blood pressure check with an MD (see if Dr. Amalia Hailey is available on thursday) You also need a 6 week post surgery physical appoinment. If you are interested in an IUD  for contraception. let the office know when setting up the 6 wk PP visit. Contact information: [address] [place] [address] [phone]                     09/13/2021  Imagene Riches, CNM

## 2021-09-16 ENCOUNTER — Encounter: Payer: BC Managed Care – PPO | Admitting: Obstetrics & Gynecology

## 2021-09-18 ENCOUNTER — Ambulatory Visit (INDEPENDENT_AMBULATORY_CARE_PROVIDER_SITE_OTHER): Payer: BC Managed Care – PPO | Admitting: Obstetrics & Gynecology

## 2021-09-18 VITALS — BP 130/78 | HR 86 | Resp 16 | Ht 60.0 in | Wt 172.6 lb

## 2021-09-18 DIAGNOSIS — Z98891 History of uterine scar from previous surgery: Secondary | ICD-10-CM

## 2021-09-18 NOTE — Progress Notes (Signed)
    OBSTETRICS/GYNECOLOGY POST-OPERATIVE CLINIC VISIT  Subjective:     Avary Eichenberger is a 31 y.o. female who presents to the clinic 1 weeks status post CESAREAN SECTION for  delivery . Eating a regular diet without difficulty. Bowel movements are normal. The patient is not having any pain.  The following portions of the patient's history were reviewed and updated as appropriate: allergies, current medications, past family history, past medical history, past social history, past surgical history, and problem list.  Review of Systems Pertinent items are noted in HPI.   Objective:   BP 130/78   Pulse 86   Resp 16   Ht 5' (1.524 m)   Wt 172 lb 9.6 oz (78.3 kg)   SpO2 98%   BMI 33.71 kg/m  Body mass index is 33.71 kg/m.  General:  alert and no distress  Abdomen: soft, bowel sounds active, non-tender  Incision:   healing well, no drainage, no erythema, no hernia, no seroma, no swelling, no dehiscence, incision well approximated    Pathology:    Assessment:   Patient s/p CESAREAN SECTION (surgery)  Doing well postoperatively.   Plan:   1. Continue any current medications as instructed by provider. 2. Wound care discussed. 3. Operative findings again reviewed. Pathology report discussed. 4. Activity restrictions: no bending, stooping, or squatting and no lifting more than 10 pounds 5. Anticipated return to work:  Late December . 6. Follow up: 1 week for 2 week ppv (televisit)     Linzie Collin, MD  [facility]  09/18/2021 3:22 PM

## 2021-09-25 ENCOUNTER — Encounter: Payer: Self-pay | Admitting: Obstetrics & Gynecology

## 2021-09-25 ENCOUNTER — Ambulatory Visit (INDEPENDENT_AMBULATORY_CARE_PROVIDER_SITE_OTHER): Payer: BC Managed Care – PPO | Admitting: Obstetrics & Gynecology

## 2021-09-25 NOTE — Progress Notes (Signed)
Subjective:     Kristina Kim is a 31 y.o. female who presents for a postpartum visit. She is 2 weeks postpartum following a low cervical transverse Cesarean section. I have fully reviewed the prenatal and intrapartum course. The delivery was at 37 gestational weeks. Outcome: primary cesarean section, low transverse incision. Anesthesia: epidural. Postpartum course has been uncomplicated. Baby's course has been uncomplicated. Baby is feeding by bottle - Similac Advance. Bleeding staining only. Bowel function is normal. Bladder function is normal. Patient  is notsexually active. Contraception method is abstinence. Postpartum depression screening: negative.  The following portions of the patient's history were reviewed and updated as appropriate: allergies, current medications, past family history, past medical history, past social history, past surgical history, and problem list.  Review of Systems A comprehensive review of systems was negative.   Objective:    There were no vitals taken for this visit.  General:  Telephone visit   Breasts:    Lungs:   Heart:    Abdomen:    Vulva:   Vagina:   Cervix:    Corpus:   Adnexa:    Rectal Exam:         Assessment:    Normal postpartum exam. Pap smear not done at today's visit.   Plan:    1. Contraception: none 2. RTO in 1 year for annual visit 3. Follow up in:  Feb    2026 for pap  or as needed.

## 2021-10-19 ENCOUNTER — Encounter: Payer: Self-pay | Admitting: Obstetrics & Gynecology

## 2021-10-21 ENCOUNTER — Ambulatory Visit: Payer: BC Managed Care – PPO | Admitting: Obstetrics & Gynecology

## 2021-10-21 ENCOUNTER — Ambulatory Visit (INDEPENDENT_AMBULATORY_CARE_PROVIDER_SITE_OTHER): Payer: BC Managed Care – PPO | Admitting: Obstetrics

## 2021-10-21 NOTE — Progress Notes (Signed)
Kristina Kim Visit Note  Kristina Kim is a 31 y.o. G85P1001 female who presents for a postpartum visit. She is 6 weeks postpartum following a primary cesarean section.  I have fully reviewed the prenatal and intrapartum course. The delivery was at [redacted]w[redacted]d gestation. Pregnancy was complicated by preeclampsia with severe features. Anesthesia: epidural. Postpartum course has been uncomplicated. Baby is doing well. Baby is feeding by bottle. Bleeding : light . Bowel function is normal. Bladder function is normal. Patient is not sexually active. Contraception method is rhythm method. Postpartum depression screening: negative.   Upstream - 10/21/21 1642       Pregnancy Intention Screening   Does the patient want to become pregnant in the next year? Unsure    Does the patient's partner want to become pregnant in the next year? Unsure    Would the patient like to discuss contraceptive options today? No      Contraception Wrap Up   Current Method FAM or LAM    End Method FAM or LAM            The pregnancy intention screening data noted above was reviewed. Potential methods of contraception were discussed. The patient elected to proceed with FAM or LAM.   Edinburgh Postnatal Depression Scale - 10/21/21 1400       Edinburgh Postnatal Depression Scale:  In the Past 7 Days   I have been able to laugh and see the funny side of things. 0    I have looked forward with enjoyment to things. 0    I have blamed myself unnecessarily when things went wrong. 1    I have been anxious or worried for no good reason. 0    I have felt scared or panicky for no good reason. 0    Things have been getting on top of me. 1    I have been so unhappy that I have had difficulty sleeping. 0    I have felt sad or miserable. 0    I have been so unhappy that I have been crying. 0    The thought of harming myself has occurred to me. 0    Edinburgh Postnatal Depression Scale Total 2             Health  Maintenance Due  Topic Date Due   COVID-19 Vaccine (1) Never done   INFLUENZA VACCINE  Never done    The following portions of the patient's history were reviewed and updated as appropriate: allergies, current medications, past family history, past medical history, past social history, past surgical history, and problem list.  Review of Systems A comprehensive review of systems was negative.  Objective:  BP 122/74   Ht 5' (1.524 m)   Wt 167 lb (75.8 kg)   Breastfeeding No   BMI 32.61 kg/m    General:  alert, cooperative, and appears stated age   Breasts:  normal  Lungs: clear to auscultation bilaterally  Heart:  regular rate and rhythm, S1, S2 normal, no murmur, click, rub or gallop  Abdomen: soft, non-tender; bowel sounds normal; no masses,  no organomegaly   Wound well approximated incision  GU exam:  normal       Assessment:   6 weeks PP S/p C/S Normal postpartum exam.   Plan:   Essential components of care per ACOG recommendations:  1.  Mood and well being: Patient with negative depression screening today. Reviewed local resources for support.  - Patient tobacco use? No.   -  hx of drug use? Occasional MJ    2. Infant care and feeding:  -Patient currently breastmilk feeding? No.  -Social determinants of health (SDOH) reviewed in EPIC. No concerns.  3. Sexuality, contraception and birth spacing - Patient is undecided about pregnancy in the next year.  Desired family size is 3 children.  - Reviewed reproductive life planning. Reviewed contraceptive methods based on pt preferences and effectiveness.  Patient desired FAM or LAM today.   - Discussed birth spacing of 18 months  4. Sleep and fatigue -Encouraged family/partner/community support of 4 hrs of uninterrupted sleep to help with mood and fatigue  5. Physical Recovery  - Discussed patients delivery and complications. She describes her labor as mixed. - Patient had a C-section.  - Patient has urinary  incontinence? No. - Patient is safe to resume physical and sexual activity  6.  Health Maintenance - HM due items addressed Yes - Last pap smear  Diagnosis  Date Value Ref Range Status  02/17/2021   Final   - Negative for intraepithelial lesion or malignancy (NILM)   Pap smear not done at today's visit.  -Breast Cancer screening indicated? No.   7. Chronic Disease/Pregnancy Condition follow up: Hypertension Branden stopped taking antihypertensives 3 weeks ago and BPs have remained normal. Encouraged to periodically check BPs and follow up if they are elevated. Discussed planning for healthy future pregnancies.  - PCP follow up  Glenetta Borg, CNM [facility] at [facility]

## 2021-12-17 ENCOUNTER — Ambulatory Visit: Payer: BC Managed Care – PPO | Admitting: Surgery

## 2022-02-23 ENCOUNTER — Ambulatory Visit: Payer: BC Managed Care – PPO | Admitting: Surgery

## 2022-07-23 IMAGING — US US OB < 14 WEEKS - US OB TV
1 series · 14 of 28 positions shown · non-contrast
Comparison: None.

CLINICAL DATA: Abdominal pain during first trimester pregnancy

EXAM:
OBSTETRIC <14 WK US AND TRANSVAGINAL OB US
TECHNIQUE: Both transabdominal and transvaginal ultrasound examinations were
performed for complete evaluation of the gestation as well as the
maternal uterus, adnexal regions, and pelvic cul-de-sac.
Transvaginal technique was performed to assess early pregnancy.

[Series 1: us ob less than 14 weeks with ob transvaginal · 14 of 123 slices shown]
[im 5/123]
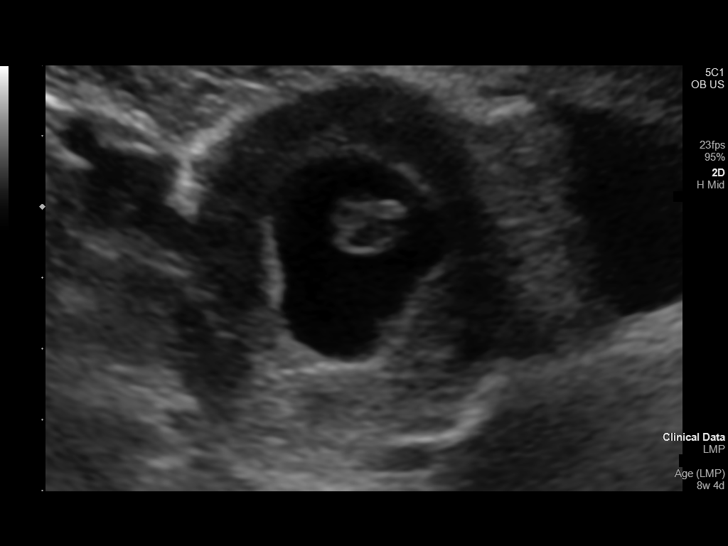
[im 14/123]
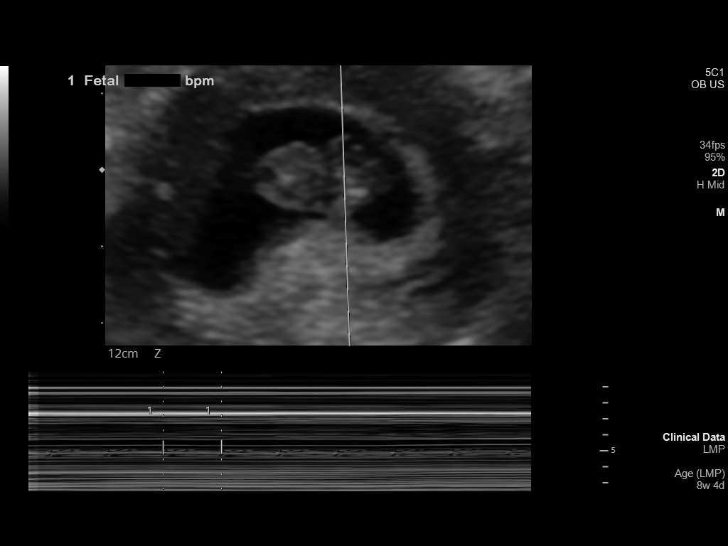
[im 23/123]
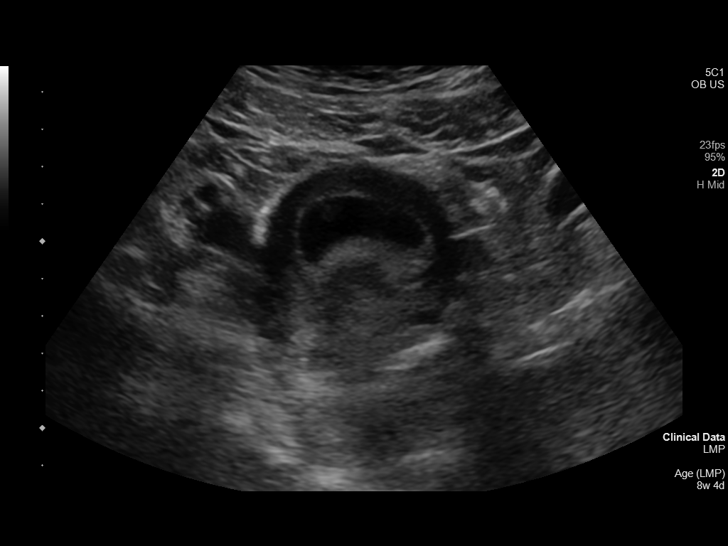
[im 32/123]
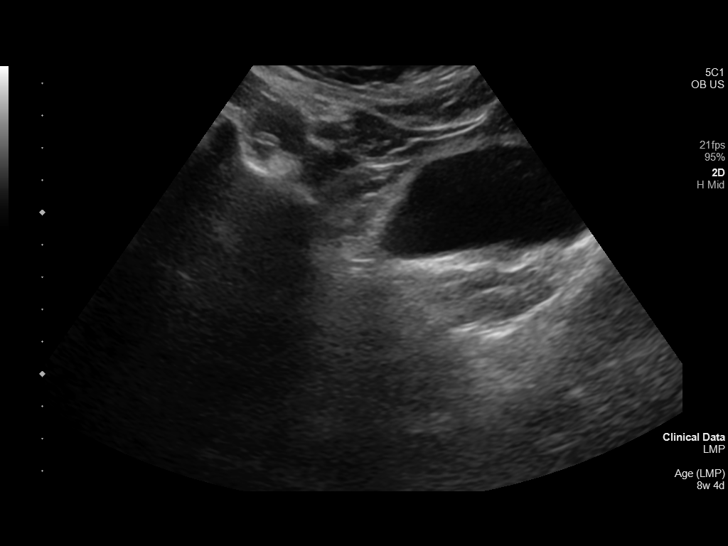
[im 41/123]
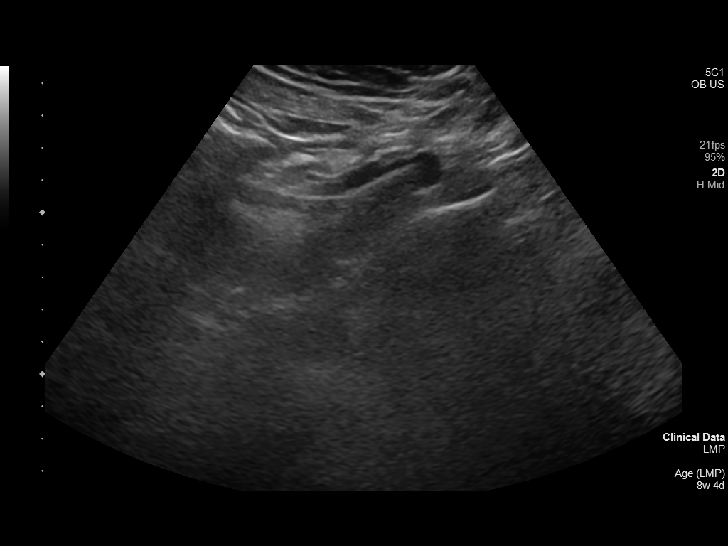
[im 50/123]
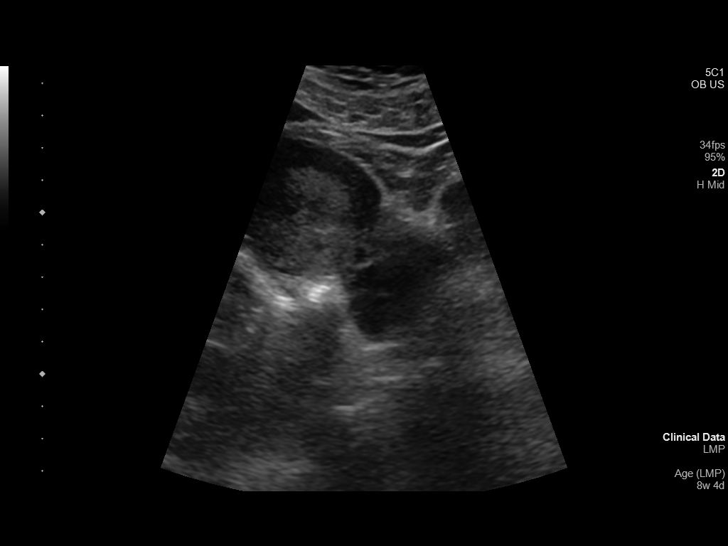
[im 59/123]
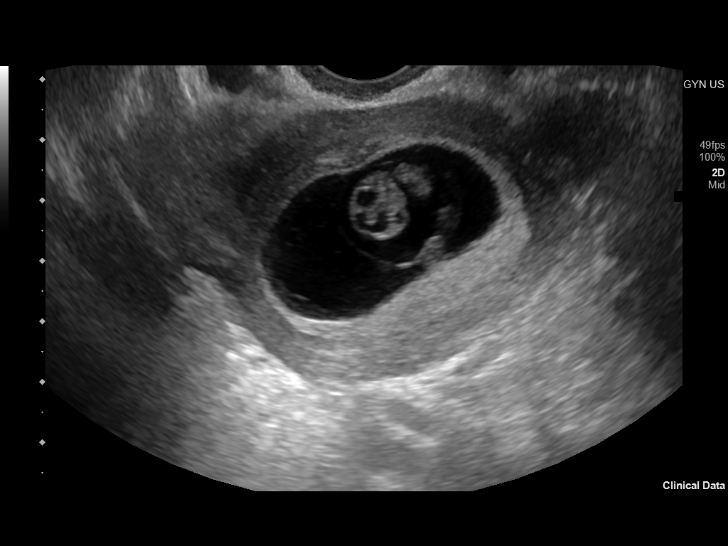
[im 68/123]
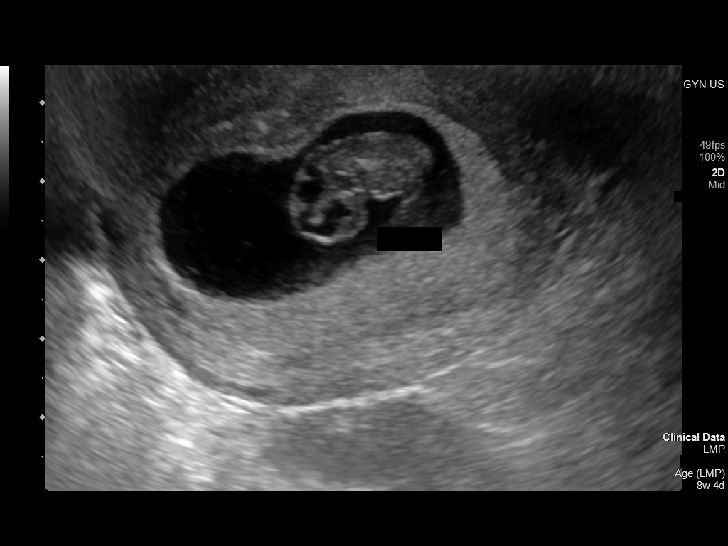
[im 77/123]
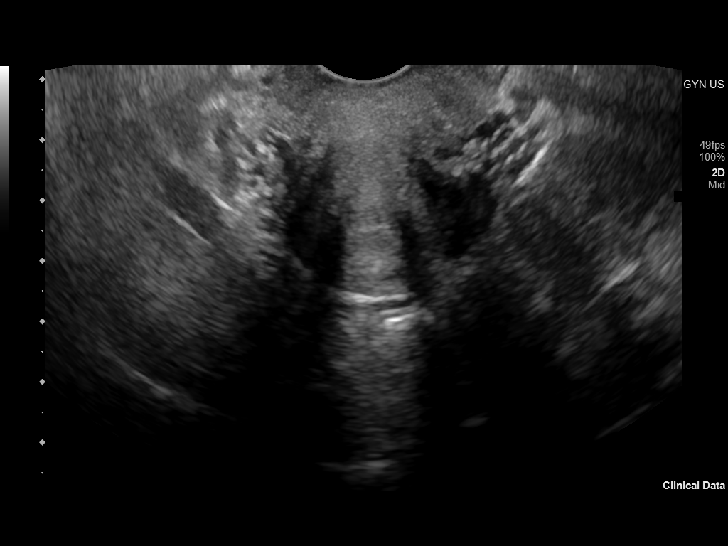
[im 86/123]
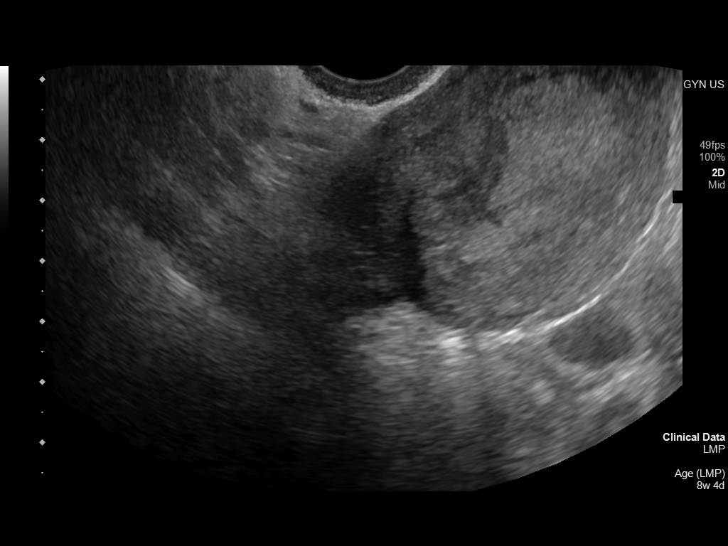
[im 95/123]
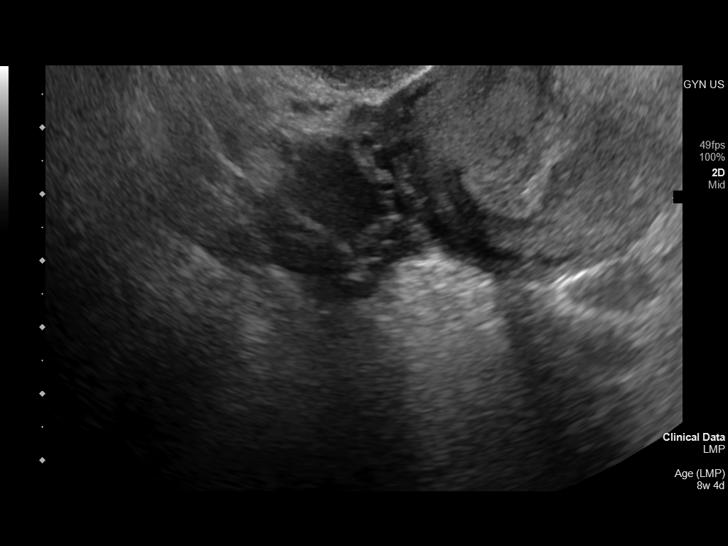
[im 104/123]
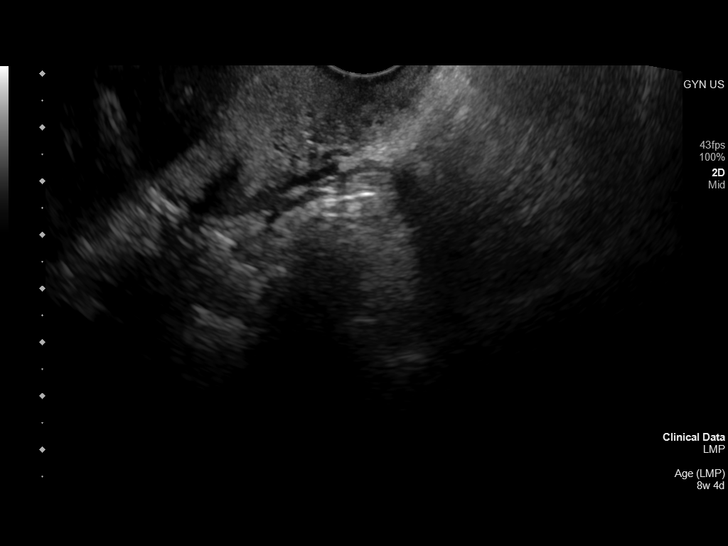
[im 113/123]
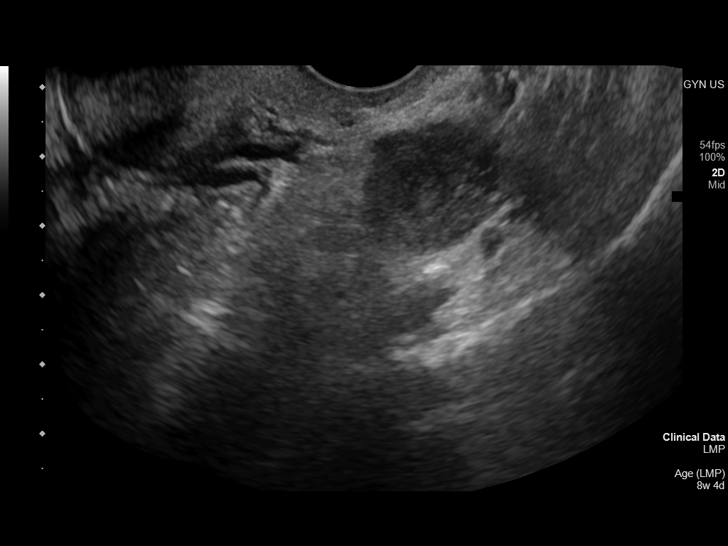
[im 123/123]
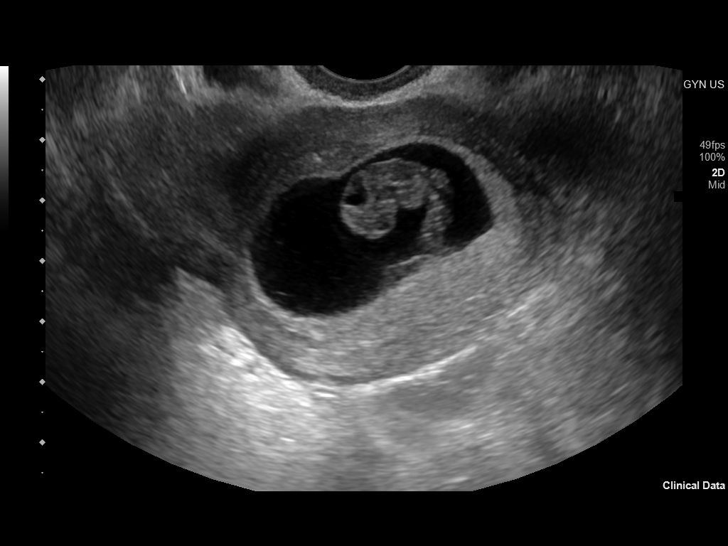

[14 of 28 positions shown; findings below may reference images not displayed]

FINDINGS: Intrauterine gestational sac: Single

Yolk sac:  Visualized.

Embryo:  Visualized.

Cardiac Activity: Visualized.

Heart Rate: 176 bpm

CRL:  19.9 mm   8 w   4 d                  US EDC: 09/25/2021

Subchorionic hemorrhage:  None visualized.

Maternal uterus/adnexae: Left ovary measures 2.7 x 2.3 x 2.3 cm,
with probable corpus luteum cyst measuring up to 2.0 x 1.9 x 1.7 cm.
Right ovary measures 3.1 x 1.5 x 1.8 cm and is unremarkable. No free
fluid.
IMPRESSION: 1. Single live intrauterine pregnancy as above, estimated age 8
weeks and 4 days.

## 2022-09-21 ENCOUNTER — Other Ambulatory Visit: Payer: Self-pay

## 2022-09-21 ENCOUNTER — Encounter: Payer: Self-pay | Admitting: Emergency Medicine

## 2022-09-21 ENCOUNTER — Emergency Department: Payer: Self-pay

## 2022-09-21 ENCOUNTER — Emergency Department
Admission: EM | Admit: 2022-09-21 | Discharge: 2022-09-21 | Disposition: A | Discharge status: Home / Self Care | Payer: Self-pay | Attending: Emergency Medicine | Admitting: Emergency Medicine

## 2022-09-21 DIAGNOSIS — K8020 Calculus of gallbladder without cholecystitis without obstruction: Secondary | ICD-10-CM | POA: Diagnosis not present

## 2022-09-21 DIAGNOSIS — E876 Hypokalemia: Secondary | ICD-10-CM | POA: Insufficient documentation

## 2022-09-21 DIAGNOSIS — R1011 Right upper quadrant pain: Secondary | ICD-10-CM | POA: Diagnosis not present

## 2022-09-21 DIAGNOSIS — R7989 Other specified abnormal findings of blood chemistry: Secondary | ICD-10-CM

## 2022-09-21 DIAGNOSIS — R0789 Other chest pain: Secondary | ICD-10-CM | POA: Insufficient documentation

## 2022-09-21 DIAGNOSIS — K828 Other specified diseases of gallbladder: Secondary | ICD-10-CM | POA: Diagnosis not present

## 2022-09-21 DIAGNOSIS — R079 Chest pain, unspecified: Secondary | ICD-10-CM | POA: Diagnosis not present

## 2022-09-21 LAB — COMPREHENSIVE METABOLIC PANEL
ALT: 134 U/L — ABNORMAL HIGH (ref 0–44)
AST: 288 U/L — ABNORMAL HIGH (ref 15–41)
Albumin: 4.3 g/dL (ref 3.5–5.0)
Alkaline Phosphatase: 92 U/L (ref 38–126)
Anion gap: 11 (ref 5–15)
BUN: 9 mg/dL (ref 6–20)
CO2: 21 mmol/L — ABNORMAL LOW (ref 22–32)
Calcium: 9.2 mg/dL (ref 8.9–10.3)
Chloride: 105 mmol/L (ref 98–111)
Creatinine, Ser: 0.56 mg/dL (ref 0.44–1.00)
GFR, Estimated: >60 mL/min (ref >60)
Glucose, Bld: 108 mg/dL — ABNORMAL HIGH (ref 70–99)
Potassium: 3.2 mmol/L — ABNORMAL LOW (ref 3.5–5.1)
Sodium: 137 mmol/L (ref 135–145)
Total Bilirubin: 0.7 mg/dL (ref 0.3–1.2)
Total Protein: 7.6 g/dL (ref 6.5–8.1)

## 2022-09-21 LAB — CBC WITH DIFFERENTIAL/PLATELET
Abs Immature Granulocytes: 0.02 10*3/uL (ref 0.00–0.07)
Basophils Absolute: 0 10*3/uL (ref 0.0–0.1)
Basophils Relative: 0 %
Eosinophils Absolute: 0.1 10*3/uL (ref 0.0–0.5)
Eosinophils Relative: 1 %
HCT: 42.6 % (ref 36.0–46.0)
Hemoglobin: 13.3 g/dL (ref 12.0–15.0)
Immature Granulocytes: 0 %
Lymphocytes Relative: 25 %
Lymphs Abs: 1.6 10*3/uL (ref 0.7–4.0)
MCH: 26.3 pg (ref 26.0–34.0)
MCHC: 31.2 g/dL (ref 30.0–36.0)
MCV: 84.2 fL (ref 80.0–100.0)
Monocytes Absolute: 0.6 10*3/uL (ref 0.1–1.0)
Monocytes Relative: 9 %
Neutro Abs: 4.1 10*3/uL (ref 1.7–7.7)
Neutrophils Relative %: 65 %
Platelets: 304 10*3/uL (ref 150–400)
RBC: 5.06 MIL/uL (ref 3.87–5.11)
RDW: 12.9 % (ref 11.5–15.5)
WBC: 6.4 10*3/uL (ref 4.0–10.5)
nRBC: 0 % (ref 0.0–0.2)

## 2022-09-21 LAB — TROPONIN I (HIGH SENSITIVITY)
Troponin I (High Sensitivity): 2 ng/L (ref <18)
Troponin I (High Sensitivity): 3 ng/L (ref <18)

## 2022-09-21 LAB — LIPASE, BLOOD: Lipase: 34 U/L (ref 11–51)

## 2022-09-21 MED ORDER — POTASSIUM CHLORIDE CRYS ER 20 MEQ PO TBCR
40.0000 meq | EXTENDED_RELEASE_TABLET | Freq: Once | ORAL | Status: AC
  Administered 2022-09-21: 40 meq via ORAL
  Filled 2022-09-21: qty 2

## 2022-09-21 NOTE — ED Provider Notes (Signed)
[facility] Provider Note    Event Date/Time   First MD Initiated Contact with Patient 09/21/22 [phone]     (approximate)   History   Chest Pain   HPI Kristina Kim is a 32 y.o. female with no chronic medical issues who presents for evaluation of acute onset chest pressure.  She said that she first started feeling the symptoms at around 1 AM.  They went away and she went to bed.  However it woke her up at about 4 AM with additional chest pain.  She is currently pain-free.  She describes the pain is more of a pressure than a sharp pain.  She has no nausea, vomiting, shortness of breath, nor abdominal pain.  She has no history of blood clots in her legs nor her lungs.  No recent unilateral leg pain or swelling.  No use of estrogen or OCPs.  No surgery or immobilizations.  No long trips recently.  She denies fever, sore throat, any new medications, nor any other significant changes in her life.     Physical Exam   Triage Vital Signs: ED Triage Vitals  Encounter Vitals Group     BP 09/21/22 0557 (!) 130/105     Systolic BP Percentile --      Diastolic BP Percentile --      Pulse Rate 09/21/22 0557 67     Resp 09/21/22 0557 19     Temp 09/21/22 0557 98.4 F (36.9 C)     Temp Source 09/21/22 0557 Oral     SpO2 09/21/22 0557 100 %     Weight 09/21/22 0554 72.1 kg (159 lb)     Height 09/21/22 0554 1.524 m (5')     Head Circumference --      Peak Flow --      Pain Score 09/21/22 0554 4     Pain Loc --      Pain Education --      Exclude from Growth Chart --     Most recent vital signs: Vitals:   09/21/22 0630 09/21/22 0641  BP:  111/65  Pulse: 74   Resp: 13   Temp:    SpO2: 100%     General: Awake, no distress.  Well-appearing. CV:  Good peripheral perfusion.  Normal heart sounds, regular rate and rhythm. Resp:  Normal effort. Speaking easily and comfortably, no accessory muscle usage nor intercostal retractions.  Lungs are clear to auscultation  bilaterally. Abd:  No distention.  No tenderness to palpation.   ED Results / Procedures / Treatments   Labs (all labs ordered are listed, but only abnormal results are displayed) Labs Reviewed  CBC WITH DIFFERENTIAL/PLATELET  COMPREHENSIVE METABOLIC PANEL  LIPASE, BLOOD  TROPONIN I (HIGH SENSITIVITY)     EKG  ED ECG REPORT I, Loleta Rose, the attending physician, personally viewed and interpreted this ECG.  Date: 09/21/2022 EKG Time: 6:01 AM Rate: 50 Rhythm: Borderline sinus bradycardia  QRS Axis: normal Intervals: normal ST/T Wave abnormalities: normal Narrative Interpretation: no evidence of acute ischemia    RADIOLOGY I viewed and interpreted the patient's 1 view chest x-ray.  No evidence of pneumonia.   PROCEDURES:  Critical Care performed: No  Procedures    IMPRESSION / MDM / ASSESSMENT AND PLAN / ED COURSE  I reviewed the triage vital signs and the nursing notes.  Differential diagnosis includes, but is not limited to, nonspecific palpitations, ACS, PE, GI source such as esophageal spasm, pancreatitis.  Patient's presentation is most consistent with acute presentation with potential threat to life or bodily function.  Labs/studies ordered: EKG, chest x-ray, CBC with differential, lipase, high-sensitivity troponin x 2, CMP  Interventions/Medications given:  Medications - No data to display  (Note:  hospital course my include additional interventions and/or labs/studies not listed above.)   Low risk for ACS based on HEAR score, PERC negative.  Reassuring EKG and CXR.  Vitals stable other than mild (and likely non-contributory) HTN.  I had my usual and customary low risk chest pain discussion with the patient.  She is comfortable with the plan for 2 high-sensitivity troponins and outpatient follow-up with her primary care doctor if there is a results are normal.  There is no indication she is suffering from a GI source  of her pain and her results are completely normal at this time.  Anxiety may be playing a role but she is comfortable with the plan for outpatient follow-up should her evaluation be reassuring tonight.  The patient is on the cardiac monitor to evaluate for evidence of arrhythmia and/or significant heart rate changes.   Clinical Course as of 09/21/22 0705  Mon Sep 21, 2022  0981 Transferring ED care to Dr. Anner Crete to follow-up on troponin x 2 and [facility] metabolic panel and lipase results.  Anticipate discharge if the patient remains stable, pain-free, and with reassuring lab work. [CF]    Clinical Course User Index [CF] Loleta Rose, MD     FINAL CLINICAL IMPRESSION(S) / ED DIAGNOSES   Final diagnoses:  Chest pressure     Rx / DC Orders   ED Discharge Orders     None        Note:  This document was prepared using Dragon voice recognition software and may include unintentional dictation errors.   Loleta Rose, MD 09/21/22 [phone]

## 2022-09-21 NOTE — ED Triage Notes (Signed)
Pt presents ambulatory to triage via POV with complaints of mid-sternal CP that started around 1AM. Pt describes the pain as tightness and she took Tylenol PTA with no improvement in sx. Denies associated sx and no radiation of pain. A&Ox4 at this time. Denies CP or SOB.

## 2022-09-21 NOTE — Discharge Instructions (Addendum)
You have been seen in the Emergency Department (ED) today for chest pain.  As we have discussed today's test results are normal, but you may require further testing.  Please follow up with the recommended doctor as instructed above in these documents regarding today's emergent visit and your recent symptoms to discuss further management.  Continue to take your regular medications.   Please follow-up with a general surgeon for your symptomatic gallstones.  Please return to the emergency department with any worsening abdominal pain, vomiting, or fevers.  Return to the Emergency Department (ED) if you experience any further chest pain/pressure/tightness, difficulty breathing, or sudden sweating, or other symptoms that concern you.

## 2022-09-21 NOTE — ED Provider Notes (Signed)
  Physical Exam  BP 111/65   Pulse 74   Temp 98.4 F (36.9 C) (Oral)   Resp 13   Ht 5' (1.524 m)   Wt 72.1 kg   LMP 08/28/2022 (Exact Date)   SpO2 100%   BMI 31.05 kg/m   Physical Exam I have reviewed the vital signs. General:  Awake, alert, no acute distress. Head:  Normocephalic, Atraumatic. EENT:  PERRL, EOMI, Oral mucosa pink and moist, Neck is supple. Cardiovascular: Regular rate, 2+ distal pulses. Respiratory:  Normal respiratory effort, symmetrical expansion, no distress.   Abdomen: No tenderness palpation noted throughout abdomen.  Soft, nondistended Extremities:  Moving all four extremities through full ROM without pain.   Neuro:  Alert and oriented.  Interacting appropriately.   Skin:  Warm, dry, no rash.   Psych: Appropriate affect.   Procedures  Procedures  ED Course / MDM   Clinical Course as of 09/21/22 0846  [facility] Sep 21, 2022  0704 Transferring ED care to Dr. Anner Crete to follow-up on troponin x 2 and [facility] metabolic panel and lipase results.  Anticipate discharge if the patient remains stable, pain-free, and with reassuring lab work. [CF]  0723 Comprehensive metabolic panel(!) Mild hypokalemia along with elevated LFTs not seen on prior laboratory work. [DW]  1027 Reassessed patient.  Stated that she did have some epigastric and right upper quadrant pain earlier as well which is gone at this time.  Will get right upper quadrant ultrasound for evaluation given elevated liver enzymes. [DW]  2536 US ABDOMEN LIMITED RUQ (LIVER/GB) Independently interpreted ultrasound images.  No gallbladder wall thickening or common bile duct dilation seen.  There does appear to be some gallstones but no acute evidence of cholecystitis.  Suspect symptomatic cholelithiasis in the setting [DW]    Clinical Course User Index [CF] Loleta Rose, MD [DW] Janith Lima, MD   Medical Decision Making Amount and/or Complexity of Data Reviewed Labs: ordered. Decision-making details  documented in ED Course. Radiology: ordered. Decision-making details documented in ED Course.   Received patient in signout from Dr. York Cerise.  32 year old female presenting today for acute chest pressure.  Currently pain-free.  No accompanied symptoms.  No chronic medical problems.  Initial EKG without ischemic findings.  Patient signed out pending serial troponins and completion of lab workup.  Serial troponins negative x 2.  Patient found to have elevated liver enzymes which is new for her.  She did report some right upper quadrant pain initially but that is no longer present at this time.  Negative Murphy sign.  Follow-up right upper quadrant ultrasound shows evidence of cholelithiasis but no overt cholecystitis.  With no leukocytosis, no active pain at this time, and stable vital signs it is more suggestive of symptomatic cholelithiasis.  Patient safe for discharge at this time and will give outpatient follow-up with general surgery for monitoring of symptomatic cholelithiasis as well as follow-up with her PCP to recheck liver enzymes a week from now.  Patient was given strict return precautions for any worsening pain symptoms.    Janith Lima, MD 09/21/22 [phone]

## 2022-09-21 NOTE — Group Note (Deleted)

## 2022-09-21 NOTE — ED Notes (Signed)
Ultrasound at bedside

## 2022-09-23 ENCOUNTER — Telehealth: Payer: Self-pay

## 2022-09-23 NOTE — Transitions of Care (Post Inpatient/ED Visit) (Signed)
   09/23/2022  Name: Kristina Kim MRN: [id number] DOB: [birth year]  Today's TOC FU Call Status: Today's TOC FU Call Status:: Unsuccessful Call (1st Attempt) Unsuccessful Call (1st Attempt) Date: 09/23/22  Attempted to reach the patient regarding the most recent Inpatient/ED visit.  Follow Up Plan: Additional outreach attempts will be made to reach the patient to complete the Transitions of Care (Post Inpatient/ED visit) call.   [facility]  Primary Care & Sports Medicine at [facility], [facility] [address]  [place] [address] Office [phone]  Fax: [phone]

## 2022-09-24 NOTE — Transitions of Care (Post Inpatient/ED Visit) (Signed)
   09/24/2022  Name: Kristina Kim MRN: [id number] DOB: Apr 03, 1990  Today's TOC FU Call Status: Today's TOC FU Call Status:: Unsuccessful Call (2nd Attempt) Unsuccessful Call (1st Attempt) Date: 09/23/22 Unsuccessful Call (2nd Attempt) Date: 09/24/22  Attempted to reach the patient regarding the most recent Inpatient/ED visit.  Follow Up Plan: Additional outreach attempts will be made to reach the patient to complete the Transitions of Care (Post Inpatient/ED visit) call.   [name]  Primary Care & Sports Medicine at [facility], [facility] [address]  [place] [address] Office [phone]  Fax: [phone]

## 2022-10-01 NOTE — Transitions of Care (Post Inpatient/ED Visit) (Signed)
10/01/2022  Name: Kristina Kim MRN: [id number] DOB: 12-05-1990  Today's TOC FU Call Status: Today's TOC FU Call Status:: Unsuccessful Call (3rd Attempt) Unsuccessful Call (1st Attempt) Date: 09/23/22 Unsuccessful Call (2nd Attempt) Date: 09/24/22 Unsuccessful Call (3rd Attempt) Date: 10/01/22  Attempted to reach the patient regarding the most recent Inpatient/ED visit.  Follow Up Plan: No further outreach attempts will be made at this time. We have been unable to contact the patient.  [name]  Primary Care & Sports Medicine at [facility], [facility] [address]  [place] [address] Office [phone]  Fax: [phone]

## 2022-10-12 ENCOUNTER — Ambulatory Visit: Payer: BC Managed Care – PPO | Admitting: Internal Medicine

## 2022-10-20 ENCOUNTER — Ambulatory Visit: Payer: BC Managed Care – PPO | Admitting: Internal Medicine

## 2022-11-17 ENCOUNTER — Encounter: Payer: Self-pay | Admitting: Internal Medicine

## 2022-11-17 ENCOUNTER — Ambulatory Visit (INDEPENDENT_AMBULATORY_CARE_PROVIDER_SITE_OTHER): Payer: Medicaid Other | Admitting: Internal Medicine

## 2022-11-17 VITALS — BP 124/78 | HR 90 | Ht 60.0 in | Wt 168.0 lb

## 2022-11-17 DIAGNOSIS — K8020 Calculus of gallbladder without cholecystitis without obstruction: Secondary | ICD-10-CM

## 2022-11-17 NOTE — Progress Notes (Signed)
Date:  11/17/2022   Name:  Kristina Kim   DOB:  05-02-1990   MRN:  [id number]   Chief Complaint: Labs Only and Abnormal LTFs  Abdominal Pain This is a recurrent problem. Episode onset: 2 months - about 4 episodes none severe. The problem occurs intermittently. The pain is located in the RUQ. The pain is moderate. Pertinent negatives include no diarrhea, fever or vomiting.  Seen in ED with abdominal pain - US showed gall stones,  LFTs elevated.  Has continued to have episodes of pain, esp after eating.  No prolonged periods of pain.  Occasional vomiting but no fever or chills.  Review of Systems  Constitutional:  Negative for chills, fatigue, fever and unexpected weight change.  Respiratory:  Negative for chest tightness and shortness of breath.   Cardiovascular:  Negative for chest pain and palpitations.  Gastrointestinal:  Positive for abdominal pain. Negative for diarrhea and vomiting.  Psychiatric/Behavioral:  Negative for dysphoric mood and sleep disturbance. The patient is not nervous/anxious.      Lab Results  Component Value Date   NA 137 09/21/2022   K 3.2 (L) 09/21/2022   CO2 21 (L) 09/21/2022   GLUCOSE 108 (H) 09/21/2022   BUN 9 09/21/2022   CREATININE 0.56 09/21/2022   CALCIUM 9.2 09/21/2022   EGFR 130 03/19/2021   GFRNONAA >60 09/21/2022   No results found for: "CHOL", "HDL", "LDLCALC", "LDLDIRECT", "TRIG", "CHOLHDL" No results found for: "TSH" No results found for: "HGBA1C" Lab Results  Component Value Date   WBC 6.4 09/21/2022   HGB 13.3 09/21/2022   HCT 42.6 09/21/2022   MCV 84.2 09/21/2022   PLT 304 09/21/2022   Lab Results  Component Value Date   ALT 134 (H) 09/21/2022   AST 288 (H) 09/21/2022   ALKPHOS 92 09/21/2022   BILITOT 0.7 09/21/2022   No results found for: "25OHVITD2", "25OHVITD3", "VD25OH"   Patient Active Problem List   Diagnosis Date Noted   Postpartum care following cesarean delivery 09/13/2021   Pregnancy 09/08/2021   Elevated  blood pressure affecting pregnancy in third trimester, antepartum 09/08/2021   Pre-eclampsia in third trimester 09/08/2021   Hypertension affecting pregnancy 03/19/2021   Supervision of normal pregnancy 02/20/2021   Breast abscess 01/16/2019   Abnormal cervical Papanicolaou smear 11/18/2014   Cervical dysplasia 08/02/2013    No Known Allergies  Past Surgical History:  Procedure Laterality Date   CESAREAN SECTION  09/09/2021   Procedure: CESAREAN SECTION;  Surgeon: Hildred Laser, MD;  Location: [facility] ORS;  Service: Obstetrics;;   COLPOSCOPY  2015   abnormal Pap [facility]   INCISION AND DRAINAGE ABSCESS Right 03/10/2019   Procedure: INCISION AND DRAINAGE ABSCESS;  Surgeon: Sung Amabile, DO;  Location: [facility] ORS;  Service: General;  Laterality: Right;   INCISION AND DRAINAGE ABSCESS Right 10/04/2019   Procedure: INCISION AND DRAINAGE BREAST ABSCESS;  Surgeon: Sung Amabile, DO;  Location: [facility] ORS;  Service: General;  Laterality: Right;    Social History   Tobacco Use   Smoking status: Never   Smokeless tobacco: Never  Vaping Use   Vaping status: Never Used  Substance Use Topics   Alcohol use: Not Currently    Comment: occassionally   Drug use: Not Currently    Types: Marijuana     Medication list has been reviewed and updated.  No outpatient medications have been marked as taking for the 11/17/22 encounter (Office Visit) with Reubin Milan, MD.       11/17/2022  3:10 PM 05/30/2021    9:26 AM 01/31/2021    9:58 AM 09/25/2020    3:41 PM  GAD 7 : Generalized Anxiety Score  Nervous, Anxious, on Edge 1 0 1 1  Control/stop worrying 0 0 0 0  Worry too much - different things 0 1 0 0  Trouble relaxing 0 0 1 1  Restless 1 1 1  0  Easily annoyed or irritable 0 1 0 0  Afraid - awful might happen 0 0 0 1  Total GAD 7 Score 2 3 3 3   Anxiety Difficulty Not difficult at all Not difficult at all Not difficult at all Not difficult at all       11/17/2022    3:10 PM 05/30/2021    9:26  AM 01/31/2021    9:58 AM  Depression screen PHQ 2/9  Decreased Interest 0 1 0  Down, Depressed, Hopeless 1 1 0  PHQ - 2 Score 1 2 0  Altered sleeping 0 2 1  Tired, decreased energy 2 1 1   Change in appetite 0 0 0  Feeling bad or failure about yourself  1 0 0  Trouble concentrating 0 1 0  Moving slowly or fidgety/restless 0 1 0  Suicidal thoughts 0 0 0  PHQ-9 Score 4 7 2   Difficult doing work/chores Not difficult at all Not difficult at all Not difficult at all    BP Readings from Last 3 Encounters:  11/17/22 124/78  09/21/22 (!) 105/53  10/21/21 122/74    Physical Exam Vitals and nursing note reviewed.  Constitutional:      General: She is not in acute distress.    Appearance: Normal appearance. She is well-developed.  HENT:     Head: Normocephalic and atraumatic.  Cardiovascular:     Rate and Rhythm: Normal rate and regular rhythm.  Pulmonary:     Effort: Pulmonary effort is normal. No respiratory distress.     Breath sounds: No wheezing.  Abdominal:     General: Abdomen is flat.     Palpations: Abdomen is soft.     Tenderness: There is no abdominal tenderness. There is no guarding or rebound.  Musculoskeletal:     Cervical back: Normal range of motion.  Lymphadenopathy:     Cervical: No cervical adenopathy.  Skin:    General: Skin is warm and dry.     Findings: No rash.  Neurological:     Mental Status: She is alert and oriented to person, place, and time.  Psychiatric:        Mood and Affect: Mood normal.        Behavior: Behavior normal.     Wt Readings from Last 3 Encounters:  11/17/22 168 lb (76.2 kg)  09/21/22 159 lb (72.1 kg)  10/21/21 167 lb (75.8 kg)    BP 124/78   Pulse 90   Ht 5' (1.524 m)   Wt 168 lb (76.2 kg)   BMI 32.81 kg/m   Assessment and Plan:  Problem List Items Addressed This Visit   None Visit Diagnoses     Symptomatic cholelithiasis    -  Primary   Recommend low fat diet refer to General Surgery   Relevant Orders    Ambulatory referral to General Surgery   CBC with Differential/Platelet   Comprehensive metabolic panel       No follow-ups on file.    Reubin Milan, MD [facility] Primary Care and Sports Medicine [place]

## 2022-11-18 LAB — COMPREHENSIVE METABOLIC PANEL
ALT: 33 [IU]/L — ABNORMAL HIGH (ref 0–32)
AST: 24 [IU]/L (ref 0–40)
Albumin: 4.5 g/dL (ref 4.0–5.0)
Alkaline Phosphatase: 106 [IU]/L (ref 44–121)
BUN/Creatinine Ratio: 17 (ref 9–23)
BUN: 13 mg/dL (ref 6–20)
Bilirubin Total: 0.3 mg/dL (ref 0.0–1.2)
CO2: 21 mmol/L (ref 20–29)
Calcium: 9.3 mg/dL (ref 8.7–10.2)
Chloride: 106 mmol/L (ref 96–106)
Creatinine, Ser: 0.78 mg/dL (ref 0.57–1.00)
Globulin, Total: 2.9 g/dL (ref 1.5–4.5)
Glucose: 110 mg/dL — ABNORMAL HIGH (ref 70–99)
Potassium: 3.3 mmol/L — ABNORMAL LOW (ref 3.5–5.2)
Sodium: 141 mmol/L (ref 134–144)
Total Protein: 7.4 g/dL (ref 6.0–8.5)
eGFR: 105 mL/min/{1.73_m2} (ref >59)

## 2022-11-18 LAB — CBC WITH DIFFERENTIAL/PLATELET
Basophils Absolute: 0 10*3/uL (ref 0.0–0.2)
Basos: 0 %
EOS (ABSOLUTE): 0.1 10*3/uL (ref 0.0–0.4)
Eos: 1 %
Hematocrit: 39.5 % (ref 34.0–46.6)
Hemoglobin: 12.8 g/dL (ref 11.1–15.9)
Immature Grans (Abs): 0 10*3/uL (ref 0.0–0.1)
Immature Granulocytes: 0 %
Lymphocytes Absolute: 2.3 10*3/uL (ref 0.7–3.1)
Lymphs: 28 %
MCH: 26.8 pg (ref 26.6–33.0)
MCHC: 32.4 g/dL (ref 31.5–35.7)
MCV: 83 fL (ref 79–97)
Monocytes Absolute: 0.5 10*3/uL (ref 0.1–0.9)
Monocytes: 6 %
Neutrophils Absolute: 5.1 10*3/uL (ref 1.4–7.0)
Neutrophils: 65 %
Platelets: 345 10*3/uL (ref 150–450)
RBC: 4.78 x10E6/uL (ref 3.77–5.28)
RDW: 12.6 % (ref 11.7–15.4)
WBC: 8 10*3/uL (ref 3.4–10.8)

## 2022-11-25 ENCOUNTER — Ambulatory Visit (INDEPENDENT_AMBULATORY_CARE_PROVIDER_SITE_OTHER): Payer: Medicaid Other | Admitting: Surgery

## 2022-11-25 ENCOUNTER — Telehealth: Payer: Self-pay | Admitting: Surgery

## 2022-11-25 ENCOUNTER — Encounter: Payer: Self-pay | Admitting: Surgery

## 2022-11-25 VITALS — BP 123/77 | HR 68 | Temp 99.2°F | Ht 60.0 in | Wt 167.4 lb

## 2022-11-25 DIAGNOSIS — K8020 Calculus of gallbladder without cholecystitis without obstruction: Secondary | ICD-10-CM

## 2022-11-25 DIAGNOSIS — K8050 Calculus of bile duct without cholangitis or cholecystitis without obstruction: Secondary | ICD-10-CM

## 2022-11-25 NOTE — H&P (View-Only) (Signed)
 Outpatient Surgical Follow Up  11/25/2022  Kristina Kim is an 32 y.o. female.   Chief Complaint  Patient presents with   Follow-up    gallstones    HPI: Kristina Kim known to me with history of breast abscess status post I&D.  More recently started having abdominal pain and right upper quadrant pain.  She is has had at least 3 attacks.  The pain is intermittent moderate intensity and seem to be worsened after heavy meals.  She did go to the emergency room and ultrasound was performed I have personally reviewed showing evidence of cholelithiasis normal common bile duct.  CBC and CMP is normal.  She is able to perform more than 4 METS of activity without any shortness of breath or chest pain.  Nuys any fevers or chills.  No evidence of bili obstruction.  Past Medical History:  Diagnosis Date   Abnormal Pap smear of cervix    Anxiety    Asthma    mild, no inhalers   Seasonal allergies    Supervision of normal first pregnancy, antepartum     Past Surgical History:  Procedure Laterality Date   CESAREAN SECTION  09/09/2021   Procedure: CESAREAN SECTION;  Surgeon: Hildred Laser, MD;  Location: [facility] OR[facility];  Service: Obstetrics;;   COLPOSCOPY  2015   abnormal Pap [facility]   INCISION AND DRAINAGE ABSCESS Right 03/10/2019   Procedure: INCISION AND DRAINAGE ABSCESS;  Surgeon: Sung Amabile, DO;  Location: [facility];  Service: General;  Laterality: Right;   INCISION AND DRAINAGE ABSCESS Right 10/04/2019   Procedure: INCISION AND DRAINAGE BREAST ABSCESS;  Surgeon: Sung Amabile, DO;  Location: [facility];  Service: General;  Laterality: Right;    Family History  Problem Relation Age of Onset   Cancer Maternal Grandmother 64       vaginal   Breast cancer Maternal Grandfather    Breast cancer Paternal Grandmother     Social History:  reports that she has never smoked. She has never used smokeless tobacco. She reports that she does not currently use alcohol. She reports that she does not currently use drugs  after having used the following drugs: Marijuana.  Allergies: No Known Allergies  Medications reviewed.    ROS Full ROS performed and is otherwise negative other than what is stated in HPI   BP 123/77 (BP Location: Right Arm, Patient Position: Sitting, Cuff Size: Small)   Pulse 68   Temp 99.2 F (37.3 C) (Oral)   Ht 5' (1.524 m)   Wt 167 lb 6.4 oz (75.9 kg)   SpO2 97%   BMI 32.69 kg/m   Physical Exam Vitals and nursing note reviewed. Exam conducted with a chaperone present.  Constitutional:      Appearance: Normal appearance. She is normal weight.  Eyes:     General: No scleral icterus.       Right eye: No discharge.        Left eye: No discharge.  Cardiovascular:     Rate and Rhythm: Normal rate and regular rhythm.     Heart sounds: No murmur heard. Pulmonary:     Effort: Pulmonary effort is normal. No respiratory distress.     Breath sounds: Normal breath sounds. No stridor.  Abdominal:     General: Abdomen is flat. There is no distension.     Palpations: Abdomen is soft. There is no mass.     Tenderness: There is no abdominal tenderness. There is no guarding or rebound.  Hernia: No hernia is present.  Musculoskeletal:        General: No swelling or tenderness. Normal range of motion.     Cervical back: Normal range of motion and neck supple. No rigidity or tenderness.  Lymphadenopathy:     Cervical: No cervical adenopathy.  Skin:    General: Skin is warm.     Capillary Refill: Capillary refill takes less than 2 seconds.     Coloration: Skin is not jaundiced or pale.  Neurological:     General: No focal deficit present.     Mental Status: She is alert.  Psychiatric:        Mood and Affect: Mood normal.        Thought Content: Thought content normal.        Judgment: Judgment normal.      Assessment/Plan: 32 year old female with classic signs and symptoms consistent with biliary colic.  She does have a stones.  Discussed with the patient in detail  about my recommendation for cholecystectomy.  She is to proceed The risks, benefits, complications, treatment options, and expected outcomes were discussed with the patient. The possibilities of bleeding, recurrent infection, finding a normal gallbladder, perforation of viscus organs, damage to surrounding structures, bile leak, abscess formation, needing a drain placed, the need for additional procedures, reaction to medication, pulmonary aspiration,  failure to diagnose a condition, the possible need to convert to an open procedure, and creating a complication requiring transfusion or operation were discussed with the patient. The patient and/or family concurred with the proposed plan, giving informed consent.  I Spent 40 minutes in this encounter including coordinating her care, placing orders, reviewing medical records, counseling the patient and performing documentation  Sterling Big, MD [facility] General Surgeon

## 2022-11-25 NOTE — Progress Notes (Signed)
Outpatient Surgical Follow Up  11/25/2022  Kristina Kim is an 32 y.o. female.   Chief Complaint  Patient presents with   Follow-up    gallstones    HPI: Kristina Kim known to me with history of breast abscess status post I&D.  More recently started having abdominal pain and right upper quadrant pain.  She is has had at least 3 attacks.  The pain is intermittent moderate intensity and seem to be worsened after heavy meals.  She did go to the emergency room and ultrasound was performed I have personally reviewed showing evidence of cholelithiasis normal common bile duct.  CBC and CMP is normal.  She is able to perform more than 4 METS of activity without any shortness of breath or chest pain.  Nuys any fevers or chills.  No evidence of bili obstruction.  Past Medical History:  Diagnosis Date   Abnormal Pap smear of cervix    Anxiety    Asthma    mild, no inhalers   Seasonal allergies    Supervision of normal first pregnancy, antepartum     Past Surgical History:  Procedure Laterality Date   CESAREAN SECTION  09/09/2021   Procedure: CESAREAN SECTION;  Surgeon: Hildred Laser, MD;  Location: [facility];  Service: Obstetrics;;   COLPOSCOPY  2015   abnormal Pap [facility]   INCISION AND DRAINAGE ABSCESS Right 03/10/2019   Procedure: INCISION AND DRAINAGE ABSCESS;  Surgeon: Sung Amabile, DO;  Location: [facility];  Service: General;  Laterality: Right;   INCISION AND DRAINAGE ABSCESS Right 10/04/2019   Procedure: INCISION AND DRAINAGE BREAST ABSCESS;  Surgeon: Sung Amabile, DO;  Location: [facility];  Service: General;  Laterality: Right;    Family History  Problem Relation Age of Onset   Cancer Maternal Grandmother 64       vaginal   Breast cancer Maternal Grandfather    Breast cancer Paternal Grandmother     Social History:  reports that she has never smoked. She has never used smokeless tobacco. She reports that she does not currently use alcohol. She reports that she does not currently use drugs  after having used the following drugs: Marijuana.  Allergies: No Known Allergies  Medications reviewed.    ROS Full ROS performed and is otherwise negative other than what is stated in HPI   BP 123/77 (BP Location: Right Arm, Patient Position: Sitting, Cuff Size: Small)   Pulse 68   Temp 99.2 F (37.3 C) (Oral)   Ht 5' (1.524 m)   Wt 167 lb 6.4 oz (75.9 kg)   SpO2 97%   BMI 32.69 kg/m   Physical Exam Vitals and nursing note reviewed. Exam conducted with a chaperone present.  Constitutional:      Appearance: Normal appearance. She is normal weight.  Eyes:     General: No scleral icterus.       Right eye: No discharge.        Left eye: No discharge.  Cardiovascular:     Rate and Rhythm: Normal rate and regular rhythm.     Heart sounds: No murmur heard. Pulmonary:     Effort: Pulmonary effort is normal. No respiratory distress.     Breath sounds: Normal breath sounds. No stridor.  Abdominal:     General: Abdomen is flat. There is no distension.     Palpations: Abdomen is soft. There is no mass.     Tenderness: There is no abdominal tenderness. There is no guarding or rebound.  Hernia: No hernia is present.  Musculoskeletal:        General: No swelling or tenderness. Normal range of motion.     Cervical back: Normal range of motion and neck supple. No rigidity or tenderness.  Lymphadenopathy:     Cervical: No cervical adenopathy.  Skin:    General: Skin is warm.     Capillary Refill: Capillary refill takes less than 2 seconds.     Coloration: Skin is not jaundiced or pale.  Neurological:     General: No focal deficit present.     Mental Status: She is alert.  Psychiatric:        Mood and Affect: Mood normal.        Thought Content: Thought content normal.        Judgment: Judgment normal.      Assessment/Plan: 32 year old female with classic signs and symptoms consistent with biliary colic.  She does have a stones.  Discussed with the patient in detail  about my recommendation for cholecystectomy.  She is to proceed The risks, benefits, complications, treatment options, and expected outcomes were discussed with the patient. The possibilities of bleeding, recurrent infection, finding a normal gallbladder, perforation of viscus organs, damage to surrounding structures, bile leak, abscess formation, needing a drain placed, the need for additional procedures, reaction to medication, pulmonary aspiration,  failure to diagnose a condition, the possible need to convert to an open procedure, and creating a complication requiring transfusion or operation were discussed with the patient. The patient and/or family concurred with the proposed plan, giving informed consent.  I Spent 40 minutes in this encounter including coordinating her care, placing orders, reviewing medical records, counseling the patient and performing documentation  Sterling Big, MD [facility] General Surgeon

## 2022-11-25 NOTE — Telephone Encounter (Signed)
Left message for patient to call, please inform her of the following regarding scheduled surgery with Dr. Everlene Farrier.   Pre-Admission date/time, and Surgery date at [facility].  Surgery Date: 12/01/22 Preadmission Testing Date: 11/30/22 (phone 8a-1p)  Also patient will need to call at [phone], between 1-3:00pm the day before surgery, to find out what time to arrive for surgery.  '

## 2022-11-25 NOTE — Patient Instructions (Signed)
You have requested to have your gallbladder removed. This will be done at [facility] with Dr. Pabon.  You will most likely be out of work 1-2 weeks for this surgery.  If you have FMLA or disability paperwork that needs filled out you may drop this off at our office or this can be faxed to [phone].  You will return after your post-op appointment with a lifting restriction for approximately 4 more weeks.  You will be able to eat anything you would like to following surgery. But, start by eating a bland diet and advance this as tolerated. The Gallbladder diet is below, please go as closely by this diet as possible prior to surgery to avoid any further attacks.  Please see the (blue)pre-care form that you have been given today. Our surgery scheduler will call you to verify surgery date and to go over information.   If you have any questions, please call our office.  Laparoscopic Cholecystectomy Laparoscopic cholecystectomy is surgery to remove the gallbladder. The gallbladder is located in the upper right part of the abdomen, behind the liver. It is a storage sac for bile, which is produced in the liver. Bile aids in the digestion and absorption of fats. Cholecystectomy is often done for inflammation of the gallbladder (cholecystitis). This condition is usually caused by a buildup of gallstones (cholelithiasis) in the gallbladder. Gallstones can block the flow of bile, and that can result in inflammation and pain. In severe cases, emergency surgery may be required. If emergency surgery is not required, you will have time to prepare for the procedure. Laparoscopic surgery is an alternative to open surgery. Laparoscopic surgery has a shorter recovery time. Your common bile duct may also need to be examined during the procedure. If stones are found in the common bile duct, they may be removed. LET YOUR HEALTH CARE PROVIDER KNOW ABOUT: Any allergies you have. All medicines you are taking,  including vitamins, herbs, eye drops, creams, and over-the-counter medicines. Previous problems you or members of your family have had with the use of anesthetics. Any blood disorders you have. Previous surgeries you have had.  Any medical conditions you have. RISKS AND COMPLICATIONS Generally, this is a safe procedure. However, problems may occur, including: Infection. Bleeding. Allergic reactions to medicines. Damage to other structures or organs. A stone remaining in the common bile duct. A bile leak from the cyst duct that is clipped when your gallbladder is removed. The need to convert to open surgery, which requires a larger incision in the abdomen. This may be necessary if your surgeon thinks that it is not safe to continue with a laparoscopic procedure. BEFORE THE PROCEDURE Ask your health care provider about: Changing or stopping your regular medicines. This is especially important if you are taking diabetes medicines or blood thinners. Taking medicines such as aspirin and ibuprofen. These medicines can thin your blood. Do not take these medicines before your procedure if your health care provider instructs you not to. Follow instructions from your health care provider about eating or drinking restrictions. Let your health care provider know if you develop a cold or an infection before surgery. Plan to have someone take you home after the procedure. Ask your health care provider how your surgical site will be marked or identified. You may be given antibiotic medicine to help prevent infection. PROCEDURE To reduce your risk of infection: Your health care team will wash or sanitize their hands. Your skin will be washed with soap. An IV   tube may be inserted into one of your veins. You will be given a medicine to make you fall asleep (general anesthetic). A breathing tube will be placed in your mouth. The surgeon will make several small cuts (incisions) in your abdomen. A thin,  lighted tube (laparoscope) that has a tiny camera on the end will be inserted through one of the small incisions. The camera on the laparoscope will send a picture to a TV screen (monitor) in the operating room. This will give the surgeon a good view inside your abdomen. A gas will be pumped into your abdomen. This will expand your abdomen to give the surgeon more room to perform the surgery. Other tools that are needed for the procedure will be inserted through the other incisions. The gallbladder will be removed through one of the incisions. After your gallbladder has been removed, the incisions will be closed with stitches (sutures), staples, or skin glue. Your incisions may be covered with a bandage (dressing). The procedure may vary among health care providers and hospitals. AFTER THE PROCEDURE Your blood pressure, heart rate, breathing rate, and blood oxygen level will be monitored often until the medicines you were given have worn off. You will be given medicines as needed to control your pain.   This information is not intended to replace advice given to you by your health care provider. Make sure you discuss any questions you have with your health care provider.   Document Released: 12/29/2004 Document Revised: 09/19/2014 Document Reviewed: 08/10/2012 Elsevier Interactive Patient Education 2016 [employer]   Low-Fat Diet for Gallbladder Conditions A low-fat diet can be helpful if you have pancreatitis or a gallbladder condition. With these conditions, your pancreas and gallbladder have trouble digesting fats. A healthy eating plan with less fat will help rest your pancreas and gallbladder and reduce your symptoms. WHAT DO I NEED TO KNOW ABOUT THIS DIET? Eat a low-fat diet. Reduce your fat intake to less than 20-30% of your total daily calories. This is less than 50-60 g of fat per day. Remember that you need some fat in your diet. Ask your dietician what your daily goal should  be. Choose nonfat and low-fat healthy foods. Look for the words "nonfat," "low fat," or "fat free." As a guide, look on the label and choose foods with less than 3 g of fat per serving. Eat only one serving. Avoid alcohol. Do not smoke. If you need help quitting, talk with your health care provider. Eat small frequent meals instead of three large heavy meals. WHAT FOODS CAN I EAT? Grains Include healthy grains and starches such as potatoes, wheat bread, fiber-rich cereal, and brown rice. Choose whole grain options whenever possible. In adults, whole grains should account for 45-65% of your daily calories.  Fruits and Vegetables Eat plenty of fruits and vegetables. Fresh fruits and vegetables add fiber to your diet. Meats and Other Protein Sources Eat lean meat such as chicken and pork. Trim any fat off of meat before cooking it. Eggs, fish, and beans are other sources of protein. In adults, these foods should account for 10-35% of your daily calories. Dairy Choose low-fat milk and dairy options. Dairy includes fat and protein, as well as calcium.  Fats and Oils Limit high-fat foods such as fried foods, sweets, baked goods, sugary drinks.  Other Creamy sauces and condiments, such as mayonnaise, can add extra fat. Think about whether or not you need to use them, or use smaller amounts or low fat options.   WHAT FOODS ARE NOT RECOMMENDED? High fat foods, such as: Baked goods. Ice cream. French toast. Sweet rolls. Pizza. Cheese bread. Foods covered with batter, butter, creamy sauces, or cheese. Fried foods. Sugary drinks and desserts. Foods that cause gas or bloating   This information is not intended to replace advice given to you by your health care provider. Make sure you discuss any questions you have with your health care provider.   Document Released: 01/03/2013 Document Reviewed: 01/03/2013 Elsevier Interactive Patient Education 2016 Elsevier Inc.   

## 2022-11-27 NOTE — Telephone Encounter (Signed)
Patient finally calls back, she is now informed of all dates regarding surgery.

## 2022-11-27 NOTE — Telephone Encounter (Signed)
Left messages with all numbers for patient to call.

## 2022-11-30 ENCOUNTER — Other Ambulatory Visit: Payer: Self-pay

## 2022-11-30 ENCOUNTER — Encounter
Admission: RE | Admit: 2022-11-30 | Discharge: 2022-11-30 | Disposition: A | Discharge status: Home / Self Care | Payer: Medicaid Other | Source: Ambulatory Visit | Attending: Surgery | Admitting: Surgery

## 2022-11-30 VITALS — Ht 60.0 in | Wt 167.0 lb

## 2022-11-30 DIAGNOSIS — Z01812 Encounter for preprocedural laboratory examination: Secondary | ICD-10-CM

## 2022-11-30 HISTORY — DX: Dysplasia of cervix uteri, unspecified: N87.9

## 2022-11-30 HISTORY — DX: Abscess of the breast and nipple: N61.1

## 2022-11-30 MED ORDER — INDOCYANINE GREEN 25 MG IV SOLR
1.2500 mg | Freq: Once | INTRAVENOUS | Status: AC
Start: 2022-11-30 — End: 2022-12-01
  Administered 2022-12-01: 1.25 mg via INTRAVENOUS

## 2022-11-30 MED ORDER — CELECOXIB 200 MG PO CAPS
200.0000 mg | ORAL_CAPSULE | ORAL | Status: AC
Start: 2022-12-01 — End: 2022-12-02
  Administered 2022-12-01: 200 mg via ORAL

## 2022-11-30 MED ORDER — CHLORHEXIDINE GLUCONATE 0.12 % MT SOLN
15.0000 mL | Freq: Once | OROMUCOSAL | Status: AC
Start: 2022-11-30 — End: 2022-12-01
  Administered 2022-12-01: 15 mL via OROMUCOSAL

## 2022-11-30 MED ORDER — LACTATED RINGERS IV SOLN
INTRAVENOUS | Status: DC
Start: 2022-11-30 — End: 2022-12-01

## 2022-11-30 MED ORDER — CHLORHEXIDINE GLUCONATE CLOTH 2 % EX PADS
6.0000 | MEDICATED_PAD | Freq: Once | CUTANEOUS | Status: DC
Start: 2022-11-30 — End: 2022-12-01

## 2022-11-30 MED ORDER — CEFAZOLIN SODIUM-DEXTROSE 2-4 GM/100ML-% IV SOLN
2.0000 g | INTRAVENOUS | Status: AC
Start: 2022-12-01 — End: 2022-12-02
  Administered 2022-12-01: 2 g via INTRAVENOUS

## 2022-11-30 MED ORDER — ACETAMINOPHEN 500 MG PO TABS
1000.0000 mg | ORAL_TABLET | ORAL | Status: AC
Start: 2022-12-01 — End: 2022-12-02
  Administered 2022-12-01: 1000 mg via ORAL

## 2022-11-30 MED ORDER — ORAL CARE MOUTH RINSE: 15.0000 mL | Freq: Once | OROMUCOSAL | Status: AC

## 2022-11-30 MED ORDER — GABAPENTIN 300 MG PO CAPS
300.0000 mg | ORAL_CAPSULE | ORAL | Status: AC
Start: 2022-12-01 — End: 2022-12-02
  Administered 2022-12-01: 300 mg via ORAL

## 2022-11-30 NOTE — Patient Instructions (Addendum)
Your procedure is scheduled on:   Tuesday  November 19  Report to the Registration Desk on the 1st floor of the [facility] To find out your arrival time, please call [phone] between 1PM - 3PM on:   Today November 18  If your arrival time is 6:00 am, do not arrive before that time as the [facility] entrance doors do not open until 6:00 am.  REMEMBER: Instructions that are not followed completely may result in serious medical risk, up to and including death; or upon the discretion of your surgeon and anesthesiologist your surgery may need to be rescheduled.  Do not eat food after midnight the night before surgery.  No gum chewing or hard candies.  You may however, drink CLEAR liquids up to 2 hours before you are scheduled to arrive for your surgery. Do not drink anything within 2 hours of your scheduled arrival time.  Clear liquids include: - water  - apple juice without pulp - gatorade (not RED colors) - black coffee or tea (Do NOT add milk or creamers to the coffee or tea) Do NOT drink anything that is not on this list.   One week prior to surgery: Stop Anti-inflammatories (NSAIDS) such as Advil, Aleve, Ibuprofen, Motrin, Naproxen, Naprosyn and Aspirin based products such as Excedrin, Goody's Powder, BC Powder. Stop ANY OVER THE COUNTER supplements until after surgery.  You may however, continue to take Tylenol if needed for pain up until the day of surgery.  Continue taking all of your other prescription medications up until the day of surgery.  ON THE DAY OF SURGERY ONLY TAKE THESE MEDICATIONS WITH SIPS OF WATER:    No Alcohol for 24 hours before or after surgery.  No Smoking including e-cigarettes for 24 hours before surgery.  No chewable tobacco products for at least 6 hours before surgery.  No nicotine patches on the day of surgery.  Do not use any "recreational" drugs for at least a week (preferably 2 weeks) before your surgery.  Please be advised that the  combination of cocaine and anesthesia may have negative outcomes, up to and including death. If you test positive for cocaine, your surgery will be cancelled.  On the morning of surgery brush your teeth with toothpaste and water, you may rinse your mouth with mouthwash if you wish. Do not swallow any toothpaste or mouthwash.  Use CHG Soap or wipes as directed on instruction sheet.  Do not wear jewelry, make-up, hairpins, clips or nail polish.  For welded (permanent) jewelry: bracelets, anklets, waist bands, etc.  Please have this removed prior to surgery.  If it is not removed, there is a chance that hospital personnel will need to cut it off on the day of surgery.  Do not wear lotions, powders, or perfumes.   Do not shave body hair from the neck down 48 hours before surgery.  Contact lenses, hearing aids and dentures may not be worn into surgery.  Do not bring valuables to the hospital. [facility] is not responsible for any missing/lost belongings or valuables.   Notify your doctor if there is any change in your medical condition (cold, fever, infection).  Wear comfortable clothing (specific to your surgery type) to the hospital.  After surgery, you can help prevent lung complications by doing breathing exercises.  Take deep breaths and cough every 1-2 hours.   When coughing or sneezing, hold a pillow firmly against your incision with both hands. This is called "splinting." Doing this helps  protect your incision. It also decreases belly discomfort.  If you are being discharged the day of surgery, you will not be allowed to drive home. You will need a responsible individual to drive you home and stay with you for 24 hours after surgery.   If you are taking public transportation, you will need to have a responsible individual with you.  Please call the Pre-admissions Testing Dept. at [phone] if you have any questions about these instructions.  Surgery Visitation  Policy:  Patients having surgery or a procedure may have two visitors.  Children under the age of 101 must have an adult with them who is not the patient.             Preparing for Surgery with CHLORHEXIDINE GLUCONATE (CHG) Soap  Chlorhexidine Gluconate (CHG) Soap  o An antiseptic cleaner that kills germs and bonds with the skin to continue killing germs even after washing  o Used for showering the night before surgery and morning of surgery  Before surgery, you can play an important role by reducing the number of germs on your skin.  CHG (Chlorhexidine gluconate) soap is an antiseptic cleanser which kills germs and bonds with the skin to continue killing germs even after washing.  Please do not use if you have an allergy to CHG or antibacterial soaps. If your skin becomes reddened/irritated stop using the CHG.  1. Shower the NIGHT BEFORE SURGERY and the MORNING OF SURGERY with CHG soap.  2. If you choose to wash your hair, wash your hair first as usual with your normal shampoo.  3. After shampooing, rinse your hair and body thoroughly to remove the shampoo.  4. Use CHG as you would any other liquid soap. You can apply CHG directly to the skin and wash gently with a scrungie or a clean washcloth.  5. Apply the CHG soap to your body only from the neck down. Do not use on open wounds or open sores. Avoid contact with your eyes, ears, mouth, and genitals (private parts). Wash face and genitals (private parts) with your normal soap.  6. Wash thoroughly, paying special attention to the area where your surgery will be performed.  7. Thoroughly rinse your body with warm water.  8. Do not shower/wash with your normal soap after using and rinsing off the CHG soap.  9. Pat yourself dry with a clean towel.  10. Wear clean pajamas to bed the night before surgery.  12. Place clean sheets on your bed the night of your first shower and do not sleep with pets.  13. Shower again with  the CHG soap on the day of surgery prior to arriving at the hospital.  14. Do not apply any deodorants/lotions/powders.  15. Please wear clean clothes to the hospital.

## 2022-12-01 ENCOUNTER — Ambulatory Visit
Admission: RE | Admit: 2022-12-01 | Discharge: 2022-12-01 | Disposition: A | Discharge status: Home / Self Care | Payer: Medicaid Other | Attending: Surgery | Admitting: Surgery

## 2022-12-01 ENCOUNTER — Other Ambulatory Visit: Payer: Self-pay

## 2022-12-01 ENCOUNTER — Encounter
Admission: RE | Disposition: A | Discharge status: Home / Self Care | Payer: Self-pay | Source: Home / Self Care | Attending: Surgery

## 2022-12-01 ENCOUNTER — Ambulatory Visit: Payer: Medicaid Other | Admitting: Certified Registered"

## 2022-12-01 DIAGNOSIS — K8060 Calculus of gallbladder and bile duct with cholecystitis, unspecified, without obstruction: Secondary | ICD-10-CM | POA: Insufficient documentation

## 2022-12-01 DIAGNOSIS — K8050 Calculus of bile duct without cholangitis or cholecystitis without obstruction: Secondary | ICD-10-CM

## 2022-12-01 DIAGNOSIS — K8010 Calculus of gallbladder with chronic cholecystitis without obstruction: Secondary | ICD-10-CM

## 2022-12-01 DIAGNOSIS — J45909 Unspecified asthma, uncomplicated: Secondary | ICD-10-CM | POA: Insufficient documentation

## 2022-12-01 DIAGNOSIS — Z01812 Encounter for preprocedural laboratory examination: Secondary | ICD-10-CM

## 2022-12-01 DIAGNOSIS — Z9889 Other specified postprocedural states: Secondary | ICD-10-CM | POA: Insufficient documentation

## 2022-12-01 HISTORY — PX: ROBOTIC ASSISTED LAPAROSCOPIC CHOLECYSTECTOMY: SHX6521

## 2022-12-01 LAB — POCT PREGNANCY, URINE: Preg Test, Ur: NEGATIVE

## 2022-12-01 SURGERY — CHOLECYSTECTOMY, ROBOT-ASSISTED, LAPAROSCOPIC
Anesthesia: General

## 2022-12-01 MED ORDER — LIDOCAINE HCL (PF) 2 % IJ SOLN
INTRAMUSCULAR | Status: DC | PRN
  Administered 2022-12-01: 100 mg via INTRADERMAL

## 2022-12-01 MED ORDER — BUPIVACAINE-EPINEPHRINE (PF) 0.25% -1:200000 IJ SOLN
INTRAMUSCULAR | Status: AC
  Filled 2022-12-01: qty 30

## 2022-12-01 MED ORDER — MIDAZOLAM HCL 2 MG/2ML IJ SOLN
INTRAMUSCULAR | Status: DC | PRN
  Administered 2022-12-01: 2 mg via INTRAVENOUS

## 2022-12-01 MED ORDER — BUPIVACAINE-EPINEPHRINE (PF) 0.25% -1:200000 IJ SOLN
INTRAMUSCULAR | Status: DC | PRN
  Administered 2022-12-01: 30 mL

## 2022-12-01 MED ORDER — FENTANYL CITRATE (PF) 100 MCG/2ML IJ SOLN
25.0000 ug | INTRAMUSCULAR | Status: DC | PRN
Start: 2022-12-01 — End: 2022-12-01
  Administered 2022-12-01 (×2): 25 ug via INTRAVENOUS
  Administered 2022-12-01: 50 ug via INTRAVENOUS

## 2022-12-01 MED ORDER — SUCCINYLCHOLINE CHLORIDE 200 MG/10ML IV SOSY
PREFILLED_SYRINGE | INTRAVENOUS | Status: DC | PRN
  Administered 2022-12-01: 100 mg via INTRAVENOUS

## 2022-12-01 MED ORDER — FENTANYL CITRATE (PF) 100 MCG/2ML IJ SOLN
INTRAMUSCULAR | Status: AC
  Filled 2022-12-01: qty 2

## 2022-12-01 MED ORDER — SUGAMMADEX SODIUM 200 MG/2ML IV SOLN
INTRAVENOUS | Status: DC | PRN
Start: 2022-12-01 — End: 2022-12-01
  Administered 2022-12-01: 200 mg via INTRAVENOUS

## 2022-12-01 MED ORDER — CHLORHEXIDINE GLUCONATE 0.12 % MT SOLN
OROMUCOSAL | Status: AC
Start: 2022-12-01 — End: ?
  Filled 2022-12-01: qty 15

## 2022-12-01 MED ORDER — FENTANYL CITRATE (PF) 100 MCG/2ML IJ SOLN
INTRAMUSCULAR | Status: DC | PRN
  Administered 2022-12-01: 100 ug via INTRAVENOUS

## 2022-12-01 MED ORDER — OXYCODONE HCL 5 MG/5ML PO SOLN: 5.0000 mg | Freq: Once | ORAL | Status: AC | PRN

## 2022-12-01 MED ORDER — GABAPENTIN 300 MG PO CAPS
ORAL_CAPSULE | ORAL | Status: AC
  Filled 2022-12-01: qty 1

## 2022-12-01 MED ORDER — MIDAZOLAM HCL 2 MG/2ML IJ SOLN
INTRAMUSCULAR | Status: AC
  Filled 2022-12-01: qty 2

## 2022-12-01 MED ORDER — ROCURONIUM BROMIDE 100 MG/10ML IV SOLN
INTRAVENOUS | Status: DC | PRN
  Administered 2022-12-01 (×2): 30 mg via INTRAVENOUS

## 2022-12-01 MED ORDER — BUPIVACAINE LIPOSOME 1.3 % IJ SUSP
INTRAMUSCULAR | Status: DC | PRN
  Administered 2022-12-01: 20 mL

## 2022-12-01 MED ORDER — ACETAMINOPHEN 500 MG PO TABS
ORAL_TABLET | ORAL | Status: AC
  Filled 2022-12-01: qty 2

## 2022-12-01 MED ORDER — DEXMEDETOMIDINE HCL IN NACL 80 MCG/20ML IV SOLN
INTRAVENOUS | Status: DC | PRN
  Administered 2022-12-01 (×2): 8 ug via INTRAVENOUS
  Administered 2022-12-01: 4 ug via INTRAVENOUS

## 2022-12-01 MED ORDER — DEXAMETHASONE SODIUM PHOSPHATE 10 MG/ML IJ SOLN
INTRAMUSCULAR | Status: DC | PRN
  Administered 2022-12-01: 10 mg via INTRAVENOUS

## 2022-12-01 MED ORDER — HYDROCODONE-ACETAMINOPHEN 5-325 MG PO TABS: 1.0000 | ORAL_TABLET | ORAL | 0 refills | Status: DC | PRN

## 2022-12-01 MED ORDER — CEFAZOLIN SODIUM-DEXTROSE 2-4 GM/100ML-% IV SOLN
INTRAVENOUS | Status: AC
  Filled 2022-12-01: qty 100

## 2022-12-01 MED ORDER — PROPOFOL 10 MG/ML IV BOLUS
INTRAVENOUS | Status: DC | PRN
  Administered 2022-12-01: 200 mg via INTRAVENOUS

## 2022-12-01 MED ORDER — INDOCYANINE GREEN 25 MG IV SOLR
INTRAVENOUS | Status: AC
  Filled 2022-12-01: qty 10

## 2022-12-01 MED ORDER — SODIUM CHLORIDE 0.9 % IR SOLN
Status: DC | PRN
  Administered 2022-12-01: 1

## 2022-12-01 MED ORDER — ONDANSETRON HCL 4 MG/2ML IJ SOLN
INTRAMUSCULAR | Status: DC | PRN
  Administered 2022-12-01: 4 mg via INTRAVENOUS

## 2022-12-01 MED ORDER — BUPIVACAINE LIPOSOME 1.3 % IJ SUSP
INTRAMUSCULAR | Status: AC
  Filled 2022-12-01: qty 20

## 2022-12-01 MED ORDER — OXYCODONE HCL 5 MG PO TABS
5.0000 mg | ORAL_TABLET | Freq: Once | ORAL | Status: AC | PRN
  Administered 2022-12-01: 5 mg via ORAL

## 2022-12-01 MED ORDER — CELECOXIB 200 MG PO CAPS
ORAL_CAPSULE | ORAL | Status: AC
  Filled 2022-12-01: qty 1

## 2022-12-01 MED ORDER — OXYCODONE HCL 5 MG PO TABS
ORAL_TABLET | ORAL | Status: AC
  Filled 2022-12-01: qty 1

## 2022-12-01 SURGICAL SUPPLY — 45 items
CANNULA REDUCER 12-8 DVNC XI (CANNULA) ×1 IMPLANT
CATH REDDICK CHOLANGI 4FR 50CM (CATHETERS) IMPLANT
CAUTERY HOOK MNPLR 1.6 DVNC XI (INSTRUMENTS) ×1 IMPLANT
CLIP LIGATING HEMO O LOK GREEN (MISCELLANEOUS) ×1 IMPLANT
DERMABOND ADVANCED .7 DNX12 (GAUZE/BANDAGES/DRESSINGS) ×1 IMPLANT
DRAPE ARM DVNC X/XI (DISPOSABLE) ×4 IMPLANT
DRAPE COLUMN DVNC XI (DISPOSABLE) ×1 IMPLANT
ELECT REM PT RETURN 9FT ADLT (ELECTROSURGICAL) ×1
ELECTRODE REM PT RTRN 9FT ADLT (ELECTROSURGICAL) ×1 IMPLANT
FORCEPS BPLR R/ABLATION 8 DVNC (INSTRUMENTS) ×1 IMPLANT
FORCEPS PROGRASP DVNC XI (FORCEP) ×1 IMPLANT
GLOVE BIO SURGEON STRL SZ7 (GLOVE) ×2 IMPLANT
GOWN STRL REUS W/ TWL LRG LVL3 (GOWN DISPOSABLE) ×4 IMPLANT
GOWN STRL REUS W/TWL LRG LVL3 (GOWN DISPOSABLE) ×4
IRRIGATION STRYKERFLOW (MISCELLANEOUS) IMPLANT
IRRIGATOR STRYKERFLOW (MISCELLANEOUS) ×1
IV CATH ANGIO 12GX3 LT BLUE (NEEDLE) IMPLANT
IV NS 1000ML (IV SOLUTION) ×1
IV NS 1000ML BAXH (IV SOLUTION) IMPLANT
KIT PINK PAD W/HEAD ARE REST (MISCELLANEOUS) ×1
KIT PINK PAD W/HEAD ARM REST (MISCELLANEOUS) ×1 IMPLANT
LABEL OR SOLS (LABEL) ×1 IMPLANT
MANIFOLD NEPTUNE II (INSTRUMENTS) ×1 IMPLANT
NDL HYPO 22X1.5 SAFETY MO (MISCELLANEOUS) ×1 IMPLANT
NEEDLE HYPO 22X1.5 SAFETY MO (MISCELLANEOUS) ×1 IMPLANT
NS IRRIG 500ML POUR BTL (IV SOLUTION) ×1 IMPLANT
OBTURATOR OPTICAL STND 8 DVNC (TROCAR) ×1
OBTURATOR OPTICALSTD 8 DVNC (TROCAR) ×1 IMPLANT
PACK LAP CHOLECYSTECTOMY (MISCELLANEOUS) ×1 IMPLANT
SEAL UNIV 5-12 XI (MISCELLANEOUS) ×4 IMPLANT
SET TUBE SMOKE EVAC HIGH FLOW (TUBING) ×1 IMPLANT
SOL ELECTROSURG ANTI STICK (MISCELLANEOUS) ×1
SOLUTION ELECTROSURG ANTI STCK (MISCELLANEOUS) ×1 IMPLANT
SPIKE FLUID TRANSFER (MISCELLANEOUS) ×1 IMPLANT
SPONGE T-LAP 18X18 ~~LOC~~+RFID (SPONGE) ×1 IMPLANT
SPONGE T-LAP 4X18 ~~LOC~~+RFID (SPONGE) IMPLANT
STOPCOCK 3WAY MALE LL (IV SETS) IMPLANT
SUT MNCRL AB 4-0 PS2 18 (SUTURE) ×1 IMPLANT
SUT VICRYL 0 UR6 27IN ABS (SUTURE) ×2 IMPLANT
SYR 20ML LL LF (SYRINGE) IMPLANT
SYS BAG RETRIEVAL 10MM (BASKET) ×1
SYSTEM BAG RETRIEVAL 10MM (BASKET) ×1 IMPLANT
TRAP FLUID SMOKE EVACUATOR (MISCELLANEOUS) ×1 IMPLANT
WATER STERILE IRR 3000ML UROMA (IV SOLUTION) IMPLANT
WATER STERILE IRR 500ML POUR (IV SOLUTION) ×1 IMPLANT

## 2022-12-01 NOTE — Op Note (Signed)
Robotic assisted laparoscopic Cholecystectomy  Pre-operative Diagnosis: biliary colic  Post-operative Diagnosis: same  Procedure:  Robotic assisted laparoscopic Cholecystectomy  Surgeon: Sterling Big, MD FACS  Anesthesia: Gen. with endotracheal tube  Findings: Chronic mild calculous Cholecystitis   Estimated Blood Loss: 5cc       Specimens: Gallbladder           Complications: none   Procedure Details  The patient was seen again in the Holding Room. The benefits, complications, treatment options, and expected outcomes were discussed with the patient. The risks of bleeding, infection, recurrence of symptoms, failure to resolve symptoms, bile duct damage, bile duct leak, retained common bile duct stone, bowel injury, any of which could require further surgery and/or ERCP, stent, or papillotomy were reviewed with the patient. The likelihood of improving the patient's symptoms with return to their baseline status is good.  The patient and/or family concurred with the proposed plan, giving informed consent.  The patient was taken to Operating Room, identified  and the procedure verified as Laparoscopic Cholecystectomy.  A Time Out was held and the above information confirmed.  Prior to the induction of general anesthesia, antibiotic prophylaxis was administered. VTE prophylaxis was in place. General endotracheal anesthesia was then administered and tolerated well. After the induction, the abdomen was prepped with Chloraprep and draped in the sterile fashion. The patient was positioned in the supine position.  Cut down technique was used to enter the abdominal cavity and a Hasson trochar was placed after two vicryl stitches were anchored to the fascia. Pneumoperitoneum was then created with CO2 and tolerated well without any adverse changes in the patient's vital signs.  Three 8-mm ports were placed under direct vision. All skin incisions  were infiltrated with a local anesthetic agent before  making the incision and placing the trocars.   The patient was positioned  in reverse Trendelenburg, robot was brought to the surgical field and docked in the standard fashion.  We made sure all the instrumentation was kept indirect view at all times and that there were no collision between the arms. I scrubbed out and went to the console.  The gallbladder was identified, the fundus grasped and retracted cephalad. Adhesions were lysed bluntly. The infundibulum was grasped and retracted laterally, exposing the peritoneum overlying the triangle of Calot. This was then divided and exposed in a blunt fashion. An extended critical view of the cystic duct and cystic artery was obtained.  The cystic duct was clearly identified and bluntly dissected.   Artery and duct were double clipped and divided. Using ICG cholangiography we visualize the cystic duct and CBD w/o evidence of bile injuries. The gallbladder was taken from the gallbladder fossa in a retrograde fashion with the electrocautery. The wall was thin and there was some spillage of bile that we suctioned and irrigated .  Hemostasis was achieved with the electrocautery. nspection of the right upper quadrant was performed. No bleeding, bile duct injury or leak, or bowel injury was noted. Robotic instruments and robotic arms were undocked in the standard fashion.  I scrubbed back in.  The gallbladder was removed and placed in an Endocatch bag.   Pneumoperitoneum was released.  The periumbilical port site was closed with interrumpted 0 Vicryl sutures. 4-0 subcuticular Monocryl was used to close the skin. Dermabond was  applied.  The patient was then extubated and brought to the recovery room in stable condition. Sponge, lap, and needle counts were correct at closure and at the conclusion of the case.  Sterling Big, MD, FACS

## 2022-12-01 NOTE — Transfer of Care (Signed)
Immediate Anesthesia Transfer of Care Note  Patient: Sabel Braund  Procedure(s) Performed: XI ROBOTIC ASSISTED LAPAROSCOPIC CHOLECYSTECTOMY INDOCYANINE GREEN FLUORESCENCE IMAGING (ICG)  Patient Location: PACU  Anesthesia Type:General  Level of Consciousness: awake  Airway & Oxygen Therapy: Patient Spontanous Breathing and Patient connected to face mask oxygen  Post-op Assessment: Report given to RN and Post -op Vital signs reviewed and stable  Post vital signs: stable and remains on 2L [place] O2 via wall supply  Last Vitals:  Vitals Value Taken Time  BP    Temp    Pulse 86 12/01/22 1155  Resp 21 12/01/22 1155  SpO2 95 % 12/01/22 1155  Vitals shown include unfiled device data.  Last Pain:  Vitals:   12/01/22 1003  TempSrc: Tympanic  PainSc: 0-No pain      Patients Stated Pain Goal: 0 (12/01/22 1003)  Complications: No notable events documented.

## 2022-12-01 NOTE — Progress Notes (Signed)
Patient arrived to pacu restless. Oriented to surroundings, medicated for discomfort, will continue to monitor.

## 2022-12-01 NOTE — Discharge Instructions (Addendum)
Laparoscopic Cholecystectomy, Care After  ° °These instructions give you information on caring for yourself after your procedure. Your doctor may also give you more specific instructions. Call your doctor if you have any problems or questions after your procedure.  °HOME CARE  °Change your bandages (dressings) as told by your doctor.  °Keep the wound dry and clean. Wash the wound gently with soap and water. Pat the wound dry with a clean towel.  °Do not take baths, swim, or use hot tubs for 2 weeks, or as told by your doctor.  °Only take medicine as told by your doctor.  °Eat a normal diet as told by your doctor.  °Do not lift anything heavier than 10 pounds (4.5 kg) until your doctor says it is okay.  °Do not play contact sports for 1 week, or as told by your doctor. °GET HELP IF:  °Your wound is red, puffy (swollen), or painful.  °You have yellowish-white fluid (pus) coming from the wound.  °You have fluid draining from the wound for more than 1 day.  °You have a bad smell coming from the wound.  °Your wound breaks open. °GET HELP RIGHT AWAY IF:  °You have trouble breathing.  °You have chest pain.  °You have a fever >101  °You have pain in the shoulders (shoulder strap areas) that is getting worse.  °You feel dizzy or pass out (faint).  °You have severe belly (abdominal) pain.  °You feel sick to your stomach (nauseous) or throw up (vomit) for more than 1 day. ° ° °

## 2022-12-01 NOTE — Anesthesia Preprocedure Evaluation (Signed)
Anesthesia Evaluation  Patient identified by MRN, date of birth, ID band Patient awake    Reviewed: Allergy & Precautions, NPO status , Patient's Chart, lab work & pertinent test results  Airway Mallampati: III  TM Distance: >3 FB Neck ROM: full    Dental  (+) Chipped, Dental Advidsory Given   Pulmonary neg pulmonary ROS   Pulmonary exam normal        Cardiovascular negative cardio ROS Normal cardiovascular exam     Neuro/Psych  PSYCHIATRIC DISORDERS Anxiety     negative neurological ROS     GI/Hepatic negative GI ROS, Neg liver ROS,,,  Endo/Other  negative endocrine ROS    Renal/GU      Musculoskeletal   Abdominal   Peds  Hematology negative hematology ROS (+)   Anesthesia Other Findings Past Medical History: No date: Abnormal Pap smear of cervix No date: Anxiety No date: Asthma     Comment:  mild, no inhalers No date: Breast abscess No date: Cervical dysplasia No date: Seasonal allergies No date: Supervision of normal first pregnancy, antepartum  Past Surgical History: 09/09/2021: CESAREAN SECTION     Comment:  Procedure: CESAREAN SECTION;  Surgeon: Hildred Laser,               MD;  Location: [facility] ORS;  Service: Obstetrics;; 2015: COLPOSCOPY     Comment:  abnormal Pap [facility] 03/10/2019: INCISION AND DRAINAGE ABSCESS; Right     Comment:  Procedure: INCISION AND DRAINAGE ABSCESS;  Surgeon:               Sung Amabile, DO;  Location: [facility] ORS;  Service: General;              Laterality: Right; 10/04/2019: INCISION AND DRAINAGE ABSCESS; Right     Comment:  Procedure: INCISION AND DRAINAGE BREAST ABSCESS;                Surgeon: Sung Amabile, DO;  Location: [facility] ORS;  Service:              General;  Laterality: Right;  BMI    Body Mass Index: 32.62 kg/m      Reproductive/Obstetrics negative OB ROS                             Anesthesia Physical Anesthesia Plan  ASA:  2  Anesthesia Plan: General ETT and General   Post-op Pain Management:    Induction: Intravenous  PONV Risk Score and Plan: 3 and Ondansetron, Dexamethasone and Midazolam  Airway Management Planned: Oral ETT  Additional Equipment:   Intra-op Plan:   Post-operative Plan: Extubation in OR  Informed Consent: I have reviewed the patients History and Physical, chart, labs and discussed the procedure including the risks, benefits and alternatives for the proposed anesthesia with the patient or authorized representative who has indicated his/her understanding and acceptance.     Dental Advisory Given  Plan Discussed with: Anesthesiologist, CRNA and Surgeon  Anesthesia Plan Comments: (Patient consented for risks of anesthesia including but not limited to:  - adverse reactions to medications - damage to eyes, teeth, lips or other oral mucosa - nerve damage due to positioning  - sore throat or hoarseness - Damage to heart, brain, nerves, lungs, other parts of body or loss of life  Patient voiced understanding and assent.)       Anesthesia Quick Evaluation

## 2022-12-01 NOTE — Interval H&P Note (Signed)
History and Physical Interval Note:  12/01/2022 9:46 AM  Kristina Kim  has presented today for surgery, with the diagnosis of biliary colic.  The various methods of treatment have been discussed with the patient and family. After consideration of risks, benefits and other options for treatment, the patient has consented to  Procedure(s): XI ROBOTIC ASSISTED LAPAROSCOPIC CHOLECYSTECTOMY (N/A) INDOCYANINE GREEN FLUORESCENCE IMAGING (ICG) (N/A) as a surgical intervention.  The patient's history has been reviewed, patient examined, no change in status, stable for surgery.  I have reviewed the patient's chart and labs.  Questions were answered to the patient's satisfaction.     Parissa Chiao F Yarel Rushlow

## 2022-12-01 NOTE — Anesthesia Procedure Notes (Signed)
Procedure Name: Intubation Date/Time: 12/01/2022 10:48 AM  Performed by: Maryla Morrow., CRNAPre-anesthesia Checklist: Patient identified, Patient being monitored, Timeout performed, Emergency Drugs available and Suction available Patient Re-evaluated:Patient Re-evaluated prior to induction Oxygen Delivery Method: Circle system utilized Preoxygenation: Pre-oxygenation with 100% oxygen Induction Type: IV induction and Rapid sequence Ventilation: Mask ventilation without difficulty Laryngoscope Size: 3 and McGrath Grade View: Grade I Tube type: Oral Tube size: 6.5 mm Number of attempts: 1 Airway Equipment and Method: Stylet Placement Confirmation: ETT inserted through vocal cords under direct vision, positive ETCO2 and breath sounds checked- equal and bilateral Secured at: 19 cm Tube secured with: Tape Dental Injury: Teeth and Oropharynx as per pre-operative assessment

## 2022-12-02 LAB — SURGICAL PATHOLOGY

## 2022-12-02 NOTE — Anesthesia Postprocedure Evaluation (Signed)
Anesthesia Post Note  Patient: Kristina Kim  Procedure(s) Performed: XI ROBOTIC ASSISTED LAPAROSCOPIC CHOLECYSTECTOMY INDOCYANINE GREEN FLUORESCENCE IMAGING (ICG)  Patient location during evaluation: PACU Anesthesia Type: General Level of consciousness: awake and alert Pain management: pain level controlled Vital Signs Assessment: post-procedure vital signs reviewed and stable Respiratory status: spontaneous breathing, nonlabored ventilation, respiratory function stable and patient connected to nasal cannula oxygen Cardiovascular status: blood pressure returned to baseline and stable Postop Assessment: no apparent nausea or vomiting Anesthetic complications: no   There were no known notable events for this encounter.   Last Vitals:  Vitals:   12/01/22 1246 12/01/22 1303  BP: 128/72 (!) 120/97  Pulse: 87 87  Resp: 16 15  Temp:  (!) 36.2 C  SpO2: 93% 100%    Last Pain:  Vitals:   12/01/22 1303  TempSrc: Temporal  PainSc: 6                  Stephanie Coup

## 2022-12-23 ENCOUNTER — Ambulatory Visit: Payer: Medicaid Other | Admitting: Physician Assistant

## 2022-12-23 DIAGNOSIS — Z09 Encounter for follow-up examination after completed treatment for conditions other than malignant neoplasm: Secondary | ICD-10-CM

## 2022-12-23 DIAGNOSIS — K8050 Calculus of bile duct without cholangitis or cholecystitis without obstruction: Secondary | ICD-10-CM

## 2022-12-23 NOTE — Progress Notes (Deleted)
[facility] POST-OP OFFICE VISIT  12/23/2022  HPI: Kristina Kim is a 32 y.o. female 22 days s/p robotic assisted laparoscopic cholecystectomy for biliary colic with Dr Everlene Farrier   ***  Vital signs: LMP 11/08/2022 (Exact Date)    Physical Exam: Constitutional: Well appearing female, NAD Abdomen: ***Soft, ***non-tender, non-distended, no rebound/guarding Skin: ***Laparoscopic incisions are healing well, ***no erythema or drainage   Assessment/Plan: This is a 32 y.o. female 22 days s/p robotic assisted laparoscopic cholecystectomy for biliary colic with Dr Everlene Farrier    - ***Pain control prn  - ***Reviewed wound care recommendation  - ***Reviewed lifting restrictions; 4 weeks total  - ***Reviewed surgical pathology; CCC  - *** can follow up on as needed basis; *** understands to call with questions/concerns  -- Lynden Oxford, PA-C [facility] 12/23/2022, 12:55 PM M-F: 7am - 4pm

## 2022-12-24 NOTE — Progress Notes (Signed)
Error. No Show 

## 2022-12-30 ENCOUNTER — Ambulatory Visit (INDEPENDENT_AMBULATORY_CARE_PROVIDER_SITE_OTHER): Payer: Medicaid Other | Admitting: Physician Assistant

## 2022-12-30 VITALS — BP 115/75 | HR 84 | Temp 98.0°F | Ht 60.0 in | Wt 168.0 lb

## 2022-12-30 DIAGNOSIS — K8050 Calculus of bile duct without cholangitis or cholecystitis without obstruction: Secondary | ICD-10-CM

## 2022-12-30 DIAGNOSIS — K8010 Calculus of gallbladder with chronic cholecystitis without obstruction: Secondary | ICD-10-CM

## 2022-12-30 DIAGNOSIS — Z09 Encounter for follow-up examination after completed treatment for conditions other than malignant neoplasm: Secondary | ICD-10-CM

## 2022-12-30 NOTE — Patient Instructions (Signed)

## 2022-12-30 NOTE — Progress Notes (Signed)
[facility] POST-OP OFFICE VISIT  12/30/2022  HPI: Kristina Kim is a 32 y.o. female ~1 month s/p robotic assisted laparoscopic cholecystectomy for biliary colic with Dr Everlene Farrier   She has done very well Shoulder/Abdominal soreness for 4-5 days post-op; now pain free No fever, chills, emesis She is tolerating PO; no diarrhea Incisions are well healed Ambulating well; anxious to return to exercise No other complaints   Vital signs: BP 115/75   Pulse 84   Temp 98 F (36.7 C)   Ht 5' (1.524 m)   Wt 168 lb (76.2 kg)   LMP 11/08/2022 (Exact Date)   SpO2 98%   BMI 32.81 kg/m    Physical Exam: Constitutional: Well appearing female, NAD Abdomen: Soft, non-tender, non-distended, no rebound/guarding Skin: Laparoscopic incisions are healing well, no erythema or drainage   Assessment/Plan: This is a 32 y.o. female ~1 month s/p robotic assisted laparoscopic cholecystectomy for biliary colic with Dr Everlene Farrier    - Pain control prn  - Reviewed wound care recommendation  - Reviewed lifting restrictions; She has completed these   - Reviewed surgical pathology; CCC  - She can follow up on as needed basis; She understands to call with questions/concerns  -- Lynden Oxford, PA-C [facility] 12/30/2022, 1:42 PM M-F: 7am - 4pm

## 2023-07-21 ENCOUNTER — Other Ambulatory Visit: Payer: Self-pay | Admitting: Student

## 2023-07-21 ENCOUNTER — Other Ambulatory Visit: Payer: Self-pay

## 2023-07-21 ENCOUNTER — Ambulatory Visit (INDEPENDENT_AMBULATORY_CARE_PROVIDER_SITE_OTHER): Admitting: Student

## 2023-07-21 VITALS — BP 116/72 | HR 72 | Temp 98.2°F | Ht 60.0 in | Wt 175.2 lb

## 2023-07-21 DIAGNOSIS — L0291 Cutaneous abscess, unspecified: Secondary | ICD-10-CM | POA: Diagnosis not present

## 2023-07-21 DIAGNOSIS — R319 Hematuria, unspecified: Secondary | ICD-10-CM | POA: Insufficient documentation

## 2023-07-21 DIAGNOSIS — R310 Gross hematuria: Secondary | ICD-10-CM

## 2023-07-21 NOTE — Assessment & Plan Note (Signed)
 Reports 4 days of hematuria that has since resolved. Does not taking any antithrombotic agents. Some UTI symptoms that have resolved on doxycyline. UA to check for continued hematuria.

## 2023-07-21 NOTE — Assessment & Plan Note (Addendum)
 Abscess of the right flank is improving, continue antibiotics until she completes 1 week course. She will follow up if abscess returns or she develops signs of infection.

## 2023-07-21 NOTE — Progress Notes (Signed)
 Established Patient Office Visit  Subjective   Patient ID: Kristina Kim, female    DOB: 09-20-1990  Age: 33 y.o. MRN: [id number]  Chief Complaint  Patient presents with   Cyst    Seen urgent care, drained cyst, told to follow up with PCP, on antibiotics, getter better    Patient presents for follow up abscess of the right flank. I&D on 07/15/2023 UC and is currently on doxycyline 100 mg twice daily day 5/7. Still has some drainage and tenderness from the area. Denies fever or chills or increase in size of abscess.   Notes associated gross hematuria for 4 days starting on 7/3. Not passing clots. Noted some suprapelvic discomfort and mild dysuria. Symptoms resolved after starting antibiotics. Denies flank pain, urinary frequency, urgency, or incomplete emptying.   Patient Active Problem List   Diagnosis Date Noted   Abscess 07/21/2023   Hematuria 07/21/2023   Biliary colic 12/01/2022   Postpartum care following cesarean delivery 09/13/2021   Pregnancy 09/08/2021   Elevated blood pressure affecting pregnancy in third trimester, antepartum 09/08/2021   Pre-eclampsia in third trimester 09/08/2021   Hypertension affecting pregnancy 03/19/2021   Supervision of normal pregnancy 02/20/2021   Abnormal cervical Papanicolaou smear 11/18/2014   Cervical dysplasia 08/02/2013      ROS Refer to HPI    Objective:     BP 116/72   Pulse 72   Temp 98.2 F (36.8 C) (Oral)   Ht 5' (1.524 m)   Wt 175 lb 4 oz (79.5 kg)   LMP 05/18/2023 (Approximate)   SpO2 98%   Breastfeeding No   BMI 34.23 kg/m  BP Readings from Last 3 Encounters:  07/21/23 116/72  12/30/22 115/75  12/01/22 (!) 120/97    Physical Exam Constitutional:      Appearance: Normal appearance.  HENT:     Mouth/Throat:     Mouth: Mucous membranes are moist.     Pharynx: Oropharynx is clear.  Cardiovascular:     Rate and Rhythm: Normal rate and regular rhythm.  Pulmonary:     Effort: Pulmonary effort is normal.      Breath sounds: No rhonchi or rales.  Abdominal:     General: Abdomen is flat. Bowel sounds are normal. There is no distension.     Palpations: Abdomen is soft.     Tenderness: There is no abdominal tenderness.  Musculoskeletal:        General: Normal range of motion.     Right lower leg: No edema.     Left lower leg: No edema.  Skin:    Capillary Refill: Capillary refill takes less than 2 seconds.     Comments: 2cm area of induration of the right flank scant amount of serous drainage on dressing, no fluid pocket, no warmth or significant erythema.   Neurological:     General: No focal deficit present.     Mental Status: She is alert and oriented to person, place, and time.  Psychiatric:        Mood and Affect: Mood normal.        Behavior: Behavior normal.        07/21/2023    1:20 PM 11/17/2022    3:10 PM 05/30/2021    9:26 AM  Depression screen PHQ 2/9  Decreased Interest 0 0 1  Down, Depressed, Hopeless 0 1 1  PHQ - 2 Score 0 1 2  Altered sleeping  0 2  Tired, decreased energy  2 1  Change in  appetite  0 0  Feeling bad or failure about yourself   1 0  Trouble concentrating  0 1  Moving slowly or fidgety/restless  0 1  Suicidal thoughts  0 0  PHQ-9 Score  4 7  Difficult doing work/chores  Not difficult at all Not difficult at all       07/21/2023    1:20 PM 11/17/2022    3:10 PM 05/30/2021    9:26 AM 01/31/2021    9:58 AM  GAD 7 : Generalized Anxiety Score  Nervous, Anxious, on Edge 0 1 0 1  Control/stop worrying 0 0 0 0  Worry too much - different things  0 1 0  Trouble relaxing  0 0 1  Restless  1 1 1   Easily annoyed or irritable  0 1 0  Afraid - awful might happen  0 0 0  Total GAD 7 Score  2 3 3   Anxiety Difficulty  Not difficult at all Not difficult at all Not difficult at all    No results found for any visits on 07/21/23.  Last CBC Lab Results  Component Value Date   WBC 8.0 11/17/2022   HGB 12.8 11/17/2022   HCT 39.5 11/17/2022   MCV 83 11/17/2022    MCH 26.8 11/17/2022   RDW 12.6 11/17/2022   PLT 345 11/17/2022   Last metabolic panel Lab Results  Component Value Date   GLUCOSE 110 (H) 11/17/2022   NA 141 11/17/2022   K 3.3 (L) 11/17/2022   CL 106 11/17/2022   CO2 21 11/17/2022   BUN 13 11/17/2022   CREATININE 0.78 11/17/2022   EGFR 105 11/17/2022   CALCIUM  9.3 11/17/2022   PHOS 3.3 01/17/2019   PROT 7.4 11/17/2022   ALBUMIN 4.5 11/17/2022   LABGLOB 2.9 11/17/2022   AGRATIO 1.7 03/19/2021   BILITOT 0.3 11/17/2022   ALKPHOS 106 11/17/2022   AST 24 11/17/2022   ALT 33 (H) 11/17/2022   ANIONGAP 11 09/21/2022      The ASCVD Risk score (Arnett DK, et al., 2019) failed to calculate for the following reasons:   The 2019 ASCVD risk score is only valid for ages 6 to 77    Assessment & Plan:  Gross hematuria Assessment & Plan: Reports 4 days of hematuria that has since resolved. Does not taking any antithrombotic agents. Some UTI symptoms that have resolved on doxycyline. UA to check for continued hematuria.   Orders: -     Urinalysis, Routine w reflex microscopic; Future  Abscess Assessment & Plan: Abscess of the right flank is improving, continue antibiotics until she completes 1 week course. She will follow up if abscess returns or she develops signs of infection.       Return for physical.    Harlene Saddler, MD

## 2023-07-22 ENCOUNTER — Ambulatory Visit: Payer: Self-pay | Admitting: Student

## 2023-07-22 LAB — URINALYSIS, ROUTINE W REFLEX MICROSCOPIC
Bilirubin, UA: NEGATIVE
Glucose, UA: NEGATIVE
Ketones, UA: NEGATIVE
Leukocytes,UA: NEGATIVE
Nitrite, UA: NEGATIVE
RBC, UA: NEGATIVE
Specific Gravity, UA: >=1.03 — AB (ref 1.005–1.030)
Urobilinogen, Ur: 0.2 mg/dL (ref 0.2–1.0)
pH, UA: 6 (ref 5.0–7.5)

## 2023-07-23 ENCOUNTER — Ambulatory Visit: Payer: Self-pay | Admitting: Student

## 2023-07-23 LAB — URINE CULTURE

## 2023-08-09 ENCOUNTER — Ambulatory Visit

## 2023-08-09 ENCOUNTER — Ambulatory Visit: Admitting: Internal Medicine

## 2023-09-08 ENCOUNTER — Encounter: Payer: Self-pay | Admitting: Internal Medicine

## 2023-09-08 ENCOUNTER — Encounter: Admitting: Internal Medicine

## 2023-09-08 NOTE — Progress Notes (Deleted)
 Date:  09/08/2023   Name:  Kristina Kim   DOB:  1990-04-06   MRN:  [id number]   Chief Complaint: No chief complaint on file. Kristina Kim is a 33 y.o. female who presents today for her Complete Annual Exam. She feels {DESC; WELL/FAIRLY WELL/POORLY:18703}. She reports exercising ***. She reports she is sleeping {DESC; WELL/FAIRLY WELL/POORLY:18703}. Breast complaints ***.  Health Maintenance  Topic Date Due   COVID-19 Vaccine (1) Never done   Hepatitis B Vaccine (1 of 3 - 19+ 3-dose series) Never done   HPV Vaccine (1 - Risk 3-dose SCDM series) Never done   Flu Shot  04/11/2024*   Pap with HPV screening  02/18/2024   DTaP/Tdap/Td vaccine (2 - Td or Tdap) 07/19/2031   Hepatitis C Screening  Completed   HIV Screening  Completed   Pneumococcal Vaccine  Aged Out   Meningitis B Vaccine  Aged Out  *Topic was postponed. The date shown is not the original due date.    HPI  Review of Systems  Constitutional:  Negative for fatigue and unexpected weight change.  HENT:  Negative for trouble swallowing.   Eyes:  Negative for visual disturbance.  Respiratory:  Negative for cough, chest tightness, shortness of breath and wheezing.   Cardiovascular:  Negative for chest pain, palpitations and leg swelling.  Gastrointestinal:  Negative for abdominal pain, constipation and diarrhea.  Musculoskeletal:  Negative for arthralgias and myalgias.  Neurological:  Negative for dizziness, weakness, light-headedness and headaches.     Lab Results  Component Value Date   NA 141 11/17/2022   K 3.3 (L) 11/17/2022   CO2 21 11/17/2022   GLUCOSE 110 (H) 11/17/2022   BUN 13 11/17/2022   CREATININE 0.78 11/17/2022   CALCIUM  9.3 11/17/2022   EGFR 105 11/17/2022   GFRNONAA >60 09/21/2022   No results found for: CHOL, HDL, LDLCALC, LDLDIRECT, TRIG, CHOLHDL No results found for: TSH No results found for: HGBA1C Lab Results  Component Value Date   WBC 8.0 11/17/2022   HGB 12.8 11/17/2022    HCT 39.5 11/17/2022   MCV 83 11/17/2022   PLT 345 11/17/2022   Lab Results  Component Value Date   ALT 33 (H) 11/17/2022   AST 24 11/17/2022   ALKPHOS 106 11/17/2022   BILITOT 0.3 11/17/2022   No results found for: MARIEN BOLLS, VD25OH   Patient Active Problem List   Diagnosis Date Noted   Hematuria 07/21/2023   Abnormal cervical Papanicolaou smear 11/18/2014   Cervical dysplasia 08/02/2013    No Known Allergies  Past Surgical History:  Procedure Laterality Date   CESAREAN SECTION  09/09/2021   Procedure: CESAREAN SECTION;  Surgeon: Kristina Davies, MD;  Location: [facility];  Service: Obstetrics;;   COLPOSCOPY  01/12/2013   abnormal Pap [facility]   INCISION AND DRAINAGE ABSCESS Right 03/10/2019   Procedure: INCISION AND DRAINAGE ABSCESS;  Surgeon: Tye Millet, DO;  Location: [facility];  Service: General;  Laterality: Right;   INCISION AND DRAINAGE ABSCESS Right 10/04/2019   Procedure: INCISION AND DRAINAGE BREAST ABSCESS;  Surgeon: Tye Millet, DO;  Location: [facility];  Service: General;  Laterality: Right;   ROBOTIC ASSISTED LAPAROSCOPIC CHOLECYSTECTOMY  12/01/2022    Social History   Tobacco Use   Smoking status: Never   Smokeless tobacco: Never  Vaping Use   Vaping status: Never Used  Substance Use Topics   Alcohol use: Not Currently    Comment: occassionally   Drug use: Not Currently  Types: Marijuana     Medication list has been reviewed and updated.  No outpatient medications have been marked as taking for the 09/08/23 encounter (Appointment) with Justus Leita DEL, MD.       07/21/2023    1:20 PM 11/17/2022    3:10 PM 05/30/2021    9:26 AM 01/31/2021    9:58 AM  GAD 7 : Generalized Anxiety Score  Nervous, Anxious, on Edge 0 1 0 1  Control/stop worrying 0 0 0 0  Worry too much - different things  0 1 0  Trouble relaxing  0 0 1  Restless  1 1 1   Easily annoyed or irritable  0 1 0  Afraid - awful might happen  0 0 0  Total GAD 7 Score  2 3  3   Anxiety Difficulty  Not difficult at all Not difficult at all Not difficult at all       07/21/2023    1:20 PM 11/17/2022    3:10 PM 05/30/2021    9:26 AM  Depression screen PHQ 2/9  Decreased Interest 0 0 1  Down, Depressed, Hopeless 0 1 1  PHQ - 2 Score 0 1 2  Altered sleeping  0 2  Tired, decreased energy  2 1  Change in appetite  0 0  Feeling bad or failure about yourself   1 0  Trouble concentrating  0 1  Moving slowly or fidgety/restless  0 1  Suicidal thoughts  0 0  PHQ-9 Score  4 7  Difficult doing work/chores  Not difficult at all Not difficult at all    BP Readings from Last 3 Encounters:  07/21/23 116/72  12/30/22 115/75  12/01/22 (!) 120/97    Physical Exam Vitals and nursing note reviewed.  Constitutional:      General: She is not in acute distress.    Appearance: She is well-developed.  HENT:     Head: Normocephalic and atraumatic.     Right Ear: Tympanic membrane and ear canal normal.     Left Ear: Tympanic membrane and ear canal normal.     Nose:     Right Sinus: No maxillary sinus tenderness.     Left Sinus: No maxillary sinus tenderness.  Eyes:     General: No scleral icterus.       Right eye: No discharge.        Left eye: No discharge.     Conjunctiva/sclera: Conjunctivae normal.  Neck:     Thyroid: No thyromegaly.     Vascular: No carotid bruit.  Cardiovascular:     Rate and Rhythm: Normal rate and regular rhythm.     Pulses: Normal pulses.     Heart sounds: Normal heart sounds.  Pulmonary:     Effort: Pulmonary effort is normal. No respiratory distress.     Breath sounds: No wheezing.  Abdominal:     General: Bowel sounds are normal.     Palpations: Abdomen is soft.     Tenderness: There is no abdominal tenderness.  Musculoskeletal:     Cervical back: Normal range of motion. No erythema.     Right lower leg: No edema.     Left lower leg: No edema.  Lymphadenopathy:     Cervical: No cervical adenopathy.  Skin:    General: Skin is  warm and dry.     Findings: No rash.  Neurological:     Mental Status: She is alert and oriented to person, place, and time.  Cranial Nerves: No cranial nerve deficit.     Sensory: No sensory deficit.     Deep Tendon Reflexes: Reflexes are normal and symmetric.  Psychiatric:        Attention and Perception: Attention normal.        Mood and Affect: Mood normal.     Wt Readings from Last 3 Encounters:  07/21/23 175 lb 4 oz (79.5 kg)  12/30/22 168 lb (76.2 kg)  12/01/22 167 lb (75.8 kg)    There were no vitals taken for this visit.  Assessment and Plan:  Problem List Items Addressed This Visit       Unprioritized   Hematuria   Other Visit Diagnoses       Annual physical exam    -  Primary     Screening for lipid disorders         Screening for diabetes mellitus           No follow-ups on file.    Leita HILARIO Adie, MD [facility] [place]

## 2023-10-13 ENCOUNTER — Encounter: Payer: Self-pay | Admitting: Internal Medicine

## 2023-10-13 ENCOUNTER — Encounter: Admitting: Internal Medicine

## 2023-10-13 NOTE — Progress Notes (Deleted)
 Date:  10/13/2023   Name:  Kristina Kim   DOB:  12-26-1990   MRN:  [id number]   Chief Complaint: No chief complaint on file. Kristina Kim is a 33 y.o. female who presents today for her Complete Annual Exam. She feels {DESC; WELL/FAIRLY WELL/POORLY:18703}. She reports exercising ***. She reports she is sleeping {DESC; WELL/FAIRLY WELL/POORLY:18703}. Breast complaints ***. Health Maintenance  Topic Date Due   COVID-19 Vaccine (1) Never done   Hepatitis B Vaccine (1 of 3 - 19+ 3-dose series) Never done   HPV Vaccine (1 - Risk 3-dose SCDM series) Never done   Flu Shot  04/11/2024*   Pap with HPV screening  02/18/2024   DTaP/Tdap/Td vaccine (2 - Td or Tdap) 07/19/2031   Hepatitis C Screening  Completed   HIV Screening  Completed   Pneumococcal Vaccine  Aged Out   Meningitis B Vaccine  Aged Out  *Topic was postponed. The date shown is not the original due date.   HPI  Review of Systems   Lab Results  Component Value Date   NA 141 11/17/2022   K 3.3 (L) 11/17/2022   CO2 21 11/17/2022   GLUCOSE 110 (H) 11/17/2022   BUN 13 11/17/2022   CREATININE 0.78 11/17/2022   CALCIUM  9.3 11/17/2022   EGFR 105 11/17/2022   GFRNONAA >60 09/21/2022   No results found for: CHOL, HDL, LDLCALC, LDLDIRECT, TRIG, CHOLHDL No results found for: TSH No results found for: HGBA1C Lab Results  Component Value Date   WBC 8.0 11/17/2022   HGB 12.8 11/17/2022   HCT 39.5 11/17/2022   MCV 83 11/17/2022   PLT 345 11/17/2022   Lab Results  Component Value Date   ALT 33 (H) 11/17/2022   AST 24 11/17/2022   ALKPHOS 106 11/17/2022   BILITOT 0.3 11/17/2022   No results found for: MARIEN BOLLS, VD25OH   Patient Active Problem List   Diagnosis Date Noted   Hematuria 07/21/2023   Abnormal cervical Papanicolaou smear 11/18/2014   Cervical dysplasia 08/02/2013    No Known Allergies  Past Surgical History:  Procedure Laterality Date   CESAREAN SECTION  09/09/2021    Procedure: CESAREAN SECTION;  Surgeon: Connell Davies, MD;  Location: [facility];  Service: Obstetrics;;   COLPOSCOPY  01/12/2013   abnormal Pap [facility]   INCISION AND DRAINAGE ABSCESS Right 03/10/2019   Procedure: INCISION AND DRAINAGE ABSCESS;  Surgeon: Tye Millet, DO;  Location: [facility];  Service: General;  Laterality: Right;   INCISION AND DRAINAGE ABSCESS Right 10/04/2019   Procedure: INCISION AND DRAINAGE BREAST ABSCESS;  Surgeon: Tye Millet, DO;  Location: [facility];  Service: General;  Laterality: Right;   ROBOTIC ASSISTED LAPAROSCOPIC CHOLECYSTECTOMY  12/01/2022    Social History   Tobacco Use   Smoking status: Never   Smokeless tobacco: Never  Vaping Use   Vaping status: Never Used  Substance Use Topics   Alcohol use: Not Currently    Comment: occassionally   Drug use: Not Currently    Types: Marijuana     Medication list has been reviewed and updated.  No outpatient medications have been marked as taking for the 10/13/23 encounter (Appointment) with Justus Leita DEL, MD.       07/21/2023    1:20 PM 11/17/2022    3:10 PM 05/30/2021    9:26 AM 01/31/2021    9:58 AM  GAD 7 : Generalized Anxiety Score  Nervous, Anxious, on Edge 0 1 0 1  Control/stop worrying 0  0 0 0  Worry too much - different things  0 1 0  Trouble relaxing  0 0 1  Restless  1 1 1   Easily annoyed or irritable  0 1 0  Afraid - awful might happen  0 0 0  Total GAD 7 Score  2 3 3   Anxiety Difficulty  Not difficult at all Not difficult at all Not difficult at all       07/21/2023    1:20 PM 11/17/2022    3:10 PM 05/30/2021    9:26 AM  Depression screen PHQ 2/9  Decreased Interest 0 0 1  Down, Depressed, Hopeless 0 1 1  PHQ - 2 Score 0 1 2  Altered sleeping  0 2  Tired, decreased energy  2 1  Change in appetite  0 0  Feeling bad or failure about yourself   1 0  Trouble concentrating  0 1  Moving slowly or fidgety/restless  0 1  Suicidal thoughts  0 0  PHQ-9 Score  4 7  Difficult doing  work/chores  Not difficult at all Not difficult at all    BP Readings from Last 3 Encounters:  07/21/23 116/72  12/30/22 115/75  12/01/22 (!) 120/97    Physical Exam  Wt Readings from Last 3 Encounters:  07/21/23 175 lb 4 oz (79.5 kg)  12/30/22 168 lb (76.2 kg)  12/01/22 167 lb (75.8 kg)    There were no vitals taken for this visit.  Assessment and Plan:  Problem List Items Addressed This Visit   None   No follow-ups on file.    Leita HILARIO Adie, MD [facility] [place]
